# Patient Record
Sex: Female | Born: 1935 | Race: White | Hispanic: No | Smoking: Never smoker
Health system: Southern US, Community
[De-identification: ages and names within clinical notes are randomized; demographics above are authoritative.]

## PROBLEM LIST (undated history)

## (undated) DIAGNOSIS — R609 Edema, unspecified: Secondary | ICD-10-CM

## (undated) DIAGNOSIS — E079 Disorder of thyroid, unspecified: Secondary | ICD-10-CM

## (undated) DIAGNOSIS — F0390 Unspecified dementia without behavioral disturbance: Secondary | ICD-10-CM

## (undated) DIAGNOSIS — I10 Essential (primary) hypertension: Secondary | ICD-10-CM

---

## 2015-01-03 DIAGNOSIS — Z1272 Encounter for screening for malignant neoplasm of vagina: Secondary | ICD-10-CM | POA: Diagnosis not present

## 2015-01-03 DIAGNOSIS — N952 Postmenopausal atrophic vaginitis: Secondary | ICD-10-CM | POA: Diagnosis not present

## 2015-01-06 DIAGNOSIS — Z1272 Encounter for screening for malignant neoplasm of vagina: Secondary | ICD-10-CM | POA: Diagnosis not present

## 2015-01-06 DIAGNOSIS — N952 Postmenopausal atrophic vaginitis: Secondary | ICD-10-CM | POA: Diagnosis not present

## 2015-01-18 DIAGNOSIS — R509 Fever, unspecified: Secondary | ICD-10-CM | POA: Diagnosis not present

## 2015-01-18 DIAGNOSIS — J209 Acute bronchitis, unspecified: Secondary | ICD-10-CM | POA: Diagnosis not present

## 2015-01-21 DIAGNOSIS — R05 Cough: Secondary | ICD-10-CM | POA: Diagnosis not present

## 2015-01-21 DIAGNOSIS — J40 Bronchitis, not specified as acute or chronic: Secondary | ICD-10-CM | POA: Diagnosis not present

## 2015-01-21 DIAGNOSIS — Z9119 Patient's noncompliance with other medical treatment and regimen: Secondary | ICD-10-CM | POA: Diagnosis not present

## 2015-01-24 DIAGNOSIS — T50905A Adverse effect of unspecified drugs, medicaments and biological substances, initial encounter: Secondary | ICD-10-CM | POA: Diagnosis not present

## 2015-01-24 DIAGNOSIS — R531 Weakness: Secondary | ICD-10-CM | POA: Diagnosis not present

## 2015-01-24 DIAGNOSIS — R001 Bradycardia, unspecified: Secondary | ICD-10-CM | POA: Diagnosis not present

## 2015-01-24 DIAGNOSIS — B349 Viral infection, unspecified: Secondary | ICD-10-CM | POA: Diagnosis not present

## 2015-01-24 DIAGNOSIS — I447 Left bundle-branch block, unspecified: Secondary | ICD-10-CM | POA: Diagnosis not present

## 2015-01-24 DIAGNOSIS — R9431 Abnormal electrocardiogram [ECG] [EKG]: Secondary | ICD-10-CM | POA: Diagnosis not present

## 2015-01-24 DIAGNOSIS — Z79899 Other long term (current) drug therapy: Secondary | ICD-10-CM | POA: Diagnosis not present

## 2015-01-24 DIAGNOSIS — I10 Essential (primary) hypertension: Secondary | ICD-10-CM | POA: Diagnosis not present

## 2015-01-24 DIAGNOSIS — R05 Cough: Secondary | ICD-10-CM | POA: Diagnosis not present

## 2015-02-04 DIAGNOSIS — M545 Low back pain: Secondary | ICD-10-CM | POA: Diagnosis not present

## 2015-02-04 DIAGNOSIS — I10 Essential (primary) hypertension: Secondary | ICD-10-CM | POA: Diagnosis not present

## 2015-02-06 DIAGNOSIS — M5416 Radiculopathy, lumbar region: Secondary | ICD-10-CM | POA: Diagnosis not present

## 2015-02-07 DIAGNOSIS — M5416 Radiculopathy, lumbar region: Secondary | ICD-10-CM | POA: Diagnosis not present

## 2015-02-10 DIAGNOSIS — M5416 Radiculopathy, lumbar region: Secondary | ICD-10-CM | POA: Diagnosis not present

## 2015-02-12 DIAGNOSIS — M5416 Radiculopathy, lumbar region: Secondary | ICD-10-CM | POA: Diagnosis not present

## 2015-02-14 DIAGNOSIS — M5416 Radiculopathy, lumbar region: Secondary | ICD-10-CM | POA: Diagnosis not present

## 2015-02-17 DIAGNOSIS — M5416 Radiculopathy, lumbar region: Secondary | ICD-10-CM | POA: Diagnosis not present

## 2015-02-19 DIAGNOSIS — M5416 Radiculopathy, lumbar region: Secondary | ICD-10-CM | POA: Diagnosis not present

## 2015-02-21 DIAGNOSIS — M5416 Radiculopathy, lumbar region: Secondary | ICD-10-CM | POA: Diagnosis not present

## 2015-02-24 DIAGNOSIS — M5416 Radiculopathy, lumbar region: Secondary | ICD-10-CM | POA: Diagnosis not present

## 2015-02-26 DIAGNOSIS — M5416 Radiculopathy, lumbar region: Secondary | ICD-10-CM | POA: Diagnosis not present

## 2015-02-28 DIAGNOSIS — M5416 Radiculopathy, lumbar region: Secondary | ICD-10-CM | POA: Diagnosis not present

## 2015-03-03 DIAGNOSIS — F321 Major depressive disorder, single episode, moderate: Secondary | ICD-10-CM | POA: Diagnosis not present

## 2015-03-03 DIAGNOSIS — G3184 Mild cognitive impairment, so stated: Secondary | ICD-10-CM | POA: Diagnosis not present

## 2015-03-18 DIAGNOSIS — H40013 Open angle with borderline findings, low risk, bilateral: Secondary | ICD-10-CM | POA: Diagnosis not present

## 2015-03-18 DIAGNOSIS — H3531 Nonexudative age-related macular degeneration: Secondary | ICD-10-CM | POA: Diagnosis not present

## 2015-03-18 DIAGNOSIS — H2513 Age-related nuclear cataract, bilateral: Secondary | ICD-10-CM | POA: Diagnosis not present

## 2015-03-20 DIAGNOSIS — I10 Essential (primary) hypertension: Secondary | ICD-10-CM | POA: Diagnosis not present

## 2015-03-20 DIAGNOSIS — E782 Mixed hyperlipidemia: Secondary | ICD-10-CM | POA: Diagnosis not present

## 2015-03-20 DIAGNOSIS — F321 Major depressive disorder, single episode, moderate: Secondary | ICD-10-CM | POA: Diagnosis not present

## 2015-03-20 DIAGNOSIS — R413 Other amnesia: Secondary | ICD-10-CM | POA: Diagnosis not present

## 2015-03-20 DIAGNOSIS — Z8349 Family history of other endocrine, nutritional and metabolic diseases: Secondary | ICD-10-CM | POA: Diagnosis not present

## 2015-03-20 DIAGNOSIS — E039 Hypothyroidism, unspecified: Secondary | ICD-10-CM | POA: Diagnosis not present

## 2015-04-04 DIAGNOSIS — H25011 Cortical age-related cataract, right eye: Secondary | ICD-10-CM | POA: Diagnosis not present

## 2015-04-04 DIAGNOSIS — H2513 Age-related nuclear cataract, bilateral: Secondary | ICD-10-CM | POA: Diagnosis not present

## 2015-04-04 DIAGNOSIS — F322 Major depressive disorder, single episode, severe without psychotic features: Secondary | ICD-10-CM | POA: Diagnosis not present

## 2015-04-04 DIAGNOSIS — H40003 Preglaucoma, unspecified, bilateral: Secondary | ICD-10-CM | POA: Diagnosis not present

## 2015-04-04 DIAGNOSIS — H2511 Age-related nuclear cataract, right eye: Secondary | ICD-10-CM | POA: Diagnosis not present

## 2015-05-01 DIAGNOSIS — F332 Major depressive disorder, recurrent severe without psychotic features: Secondary | ICD-10-CM | POA: Diagnosis not present

## 2015-05-01 DIAGNOSIS — R413 Other amnesia: Secondary | ICD-10-CM | POA: Diagnosis not present

## 2015-05-13 DIAGNOSIS — R8290 Unspecified abnormal findings in urine: Secondary | ICD-10-CM | POA: Diagnosis not present

## 2015-05-13 DIAGNOSIS — R609 Edema, unspecified: Secondary | ICD-10-CM | POA: Diagnosis not present

## 2015-05-13 DIAGNOSIS — R413 Other amnesia: Secondary | ICD-10-CM | POA: Diagnosis not present

## 2015-05-13 DIAGNOSIS — F321 Major depressive disorder, single episode, moderate: Secondary | ICD-10-CM | POA: Diagnosis not present

## 2015-05-18 DIAGNOSIS — R112 Nausea with vomiting, unspecified: Secondary | ICD-10-CM | POA: Diagnosis not present

## 2015-05-18 DIAGNOSIS — K449 Diaphragmatic hernia without obstruction or gangrene: Secondary | ICD-10-CM | POA: Diagnosis not present

## 2015-05-18 DIAGNOSIS — I10 Essential (primary) hypertension: Secondary | ICD-10-CM | POA: Diagnosis not present

## 2015-05-18 DIAGNOSIS — E079 Disorder of thyroid, unspecified: Secondary | ICD-10-CM | POA: Diagnosis not present

## 2015-05-18 DIAGNOSIS — Z79899 Other long term (current) drug therapy: Secondary | ICD-10-CM | POA: Diagnosis not present

## 2015-05-18 DIAGNOSIS — K5900 Constipation, unspecified: Secondary | ICD-10-CM | POA: Diagnosis not present

## 2015-05-19 DIAGNOSIS — I10 Essential (primary) hypertension: Secondary | ICD-10-CM | POA: Diagnosis not present

## 2015-05-19 DIAGNOSIS — Z79899 Other long term (current) drug therapy: Secondary | ICD-10-CM | POA: Diagnosis not present

## 2015-05-19 DIAGNOSIS — R1110 Vomiting, unspecified: Secondary | ICD-10-CM | POA: Diagnosis not present

## 2015-05-21 DIAGNOSIS — G301 Alzheimer's disease with late onset: Secondary | ICD-10-CM | POA: Diagnosis not present

## 2015-05-21 DIAGNOSIS — Z9114 Patient's other noncompliance with medication regimen: Secondary | ICD-10-CM | POA: Diagnosis not present

## 2015-05-21 DIAGNOSIS — E039 Hypothyroidism, unspecified: Secondary | ICD-10-CM | POA: Diagnosis not present

## 2015-05-21 DIAGNOSIS — F0281 Dementia in other diseases classified elsewhere with behavioral disturbance: Secondary | ICD-10-CM | POA: Diagnosis not present

## 2015-05-21 DIAGNOSIS — I10 Essential (primary) hypertension: Secondary | ICD-10-CM | POA: Diagnosis not present

## 2015-05-23 DIAGNOSIS — G301 Alzheimer's disease with late onset: Secondary | ICD-10-CM | POA: Diagnosis not present

## 2015-05-23 DIAGNOSIS — I10 Essential (primary) hypertension: Secondary | ICD-10-CM | POA: Diagnosis not present

## 2015-05-27 DIAGNOSIS — I10 Essential (primary) hypertension: Secondary | ICD-10-CM | POA: Diagnosis not present

## 2015-05-27 DIAGNOSIS — G301 Alzheimer's disease with late onset: Secondary | ICD-10-CM | POA: Diagnosis not present

## 2015-05-30 DIAGNOSIS — G301 Alzheimer's disease with late onset: Secondary | ICD-10-CM | POA: Diagnosis not present

## 2015-05-30 DIAGNOSIS — I10 Essential (primary) hypertension: Secondary | ICD-10-CM | POA: Diagnosis not present

## 2015-06-06 DIAGNOSIS — I10 Essential (primary) hypertension: Secondary | ICD-10-CM | POA: Diagnosis not present

## 2015-06-06 DIAGNOSIS — G301 Alzheimer's disease with late onset: Secondary | ICD-10-CM | POA: Diagnosis not present

## 2015-06-10 DIAGNOSIS — Z111 Encounter for screening for respiratory tuberculosis: Secondary | ICD-10-CM | POA: Diagnosis not present

## 2015-06-10 DIAGNOSIS — I10 Essential (primary) hypertension: Secondary | ICD-10-CM | POA: Diagnosis not present

## 2015-06-10 DIAGNOSIS — G301 Alzheimer's disease with late onset: Secondary | ICD-10-CM | POA: Diagnosis not present

## 2015-06-13 DIAGNOSIS — I10 Essential (primary) hypertension: Secondary | ICD-10-CM | POA: Diagnosis not present

## 2015-06-13 DIAGNOSIS — G301 Alzheimer's disease with late onset: Secondary | ICD-10-CM | POA: Diagnosis not present

## 2015-06-24 DIAGNOSIS — F0281 Dementia in other diseases classified elsewhere with behavioral disturbance: Secondary | ICD-10-CM | POA: Diagnosis not present

## 2015-06-24 DIAGNOSIS — G301 Alzheimer's disease with late onset: Secondary | ICD-10-CM | POA: Diagnosis not present

## 2015-06-24 DIAGNOSIS — I10 Essential (primary) hypertension: Secondary | ICD-10-CM | POA: Diagnosis not present

## 2015-06-24 DIAGNOSIS — E039 Hypothyroidism, unspecified: Secondary | ICD-10-CM | POA: Diagnosis not present

## 2015-07-02 DIAGNOSIS — H2513 Age-related nuclear cataract, bilateral: Secondary | ICD-10-CM | POA: Diagnosis not present

## 2015-07-02 DIAGNOSIS — H2512 Age-related nuclear cataract, left eye: Secondary | ICD-10-CM | POA: Diagnosis not present

## 2015-07-02 DIAGNOSIS — H25011 Cortical age-related cataract, right eye: Secondary | ICD-10-CM | POA: Diagnosis not present

## 2015-07-02 DIAGNOSIS — H2511 Age-related nuclear cataract, right eye: Secondary | ICD-10-CM | POA: Diagnosis not present

## 2015-07-22 DIAGNOSIS — Z885 Allergy status to narcotic agent status: Secondary | ICD-10-CM | POA: Diagnosis not present

## 2015-07-22 DIAGNOSIS — E785 Hyperlipidemia, unspecified: Secondary | ICD-10-CM | POA: Diagnosis not present

## 2015-07-22 DIAGNOSIS — H25013 Cortical age-related cataract, bilateral: Secondary | ICD-10-CM | POA: Diagnosis not present

## 2015-07-22 DIAGNOSIS — Z791 Long term (current) use of non-steroidal anti-inflammatories (NSAID): Secondary | ICD-10-CM | POA: Diagnosis not present

## 2015-07-22 DIAGNOSIS — H40003 Preglaucoma, unspecified, bilateral: Secondary | ICD-10-CM | POA: Diagnosis not present

## 2015-07-22 DIAGNOSIS — H25011 Cortical age-related cataract, right eye: Secondary | ICD-10-CM | POA: Diagnosis not present

## 2015-07-22 DIAGNOSIS — Z888 Allergy status to other drugs, medicaments and biological substances status: Secondary | ICD-10-CM | POA: Diagnosis not present

## 2015-07-22 DIAGNOSIS — I10 Essential (primary) hypertension: Secondary | ICD-10-CM | POA: Diagnosis not present

## 2015-07-22 DIAGNOSIS — Z882 Allergy status to sulfonamides status: Secondary | ICD-10-CM | POA: Diagnosis not present

## 2015-07-22 DIAGNOSIS — H35363 Drusen (degenerative) of macula, bilateral: Secondary | ICD-10-CM | POA: Diagnosis not present

## 2015-07-22 DIAGNOSIS — H2513 Age-related nuclear cataract, bilateral: Secondary | ICD-10-CM | POA: Diagnosis not present

## 2015-07-22 DIAGNOSIS — Z884 Allergy status to anesthetic agent status: Secondary | ICD-10-CM | POA: Diagnosis not present

## 2015-07-22 DIAGNOSIS — E039 Hypothyroidism, unspecified: Secondary | ICD-10-CM | POA: Diagnosis not present

## 2015-07-22 DIAGNOSIS — Z881 Allergy status to other antibiotic agents status: Secondary | ICD-10-CM | POA: Diagnosis not present

## 2015-07-22 DIAGNOSIS — Z880 Allergy status to penicillin: Secondary | ICD-10-CM | POA: Diagnosis not present

## 2015-07-22 DIAGNOSIS — Z7982 Long term (current) use of aspirin: Secondary | ICD-10-CM | POA: Diagnosis not present

## 2015-07-22 DIAGNOSIS — H2511 Age-related nuclear cataract, right eye: Secondary | ICD-10-CM | POA: Diagnosis not present

## 2015-08-11 DIAGNOSIS — F0280 Dementia in other diseases classified elsewhere without behavioral disturbance: Secondary | ICD-10-CM | POA: Diagnosis not present

## 2015-08-11 DIAGNOSIS — G301 Alzheimer's disease with late onset: Secondary | ICD-10-CM | POA: Diagnosis not present

## 2015-09-03 DIAGNOSIS — H2512 Age-related nuclear cataract, left eye: Secondary | ICD-10-CM | POA: Diagnosis not present

## 2015-09-03 DIAGNOSIS — Z885 Allergy status to narcotic agent status: Secondary | ICD-10-CM | POA: Diagnosis not present

## 2015-09-03 DIAGNOSIS — Z79899 Other long term (current) drug therapy: Secondary | ICD-10-CM | POA: Diagnosis not present

## 2015-09-03 DIAGNOSIS — Z883 Allergy status to other anti-infective agents status: Secondary | ICD-10-CM | POA: Diagnosis not present

## 2015-09-03 DIAGNOSIS — I10 Essential (primary) hypertension: Secondary | ICD-10-CM | POA: Diagnosis not present

## 2015-09-03 DIAGNOSIS — H25042 Posterior subcapsular polar age-related cataract, left eye: Secondary | ICD-10-CM | POA: Diagnosis not present

## 2015-09-03 DIAGNOSIS — Z888 Allergy status to other drugs, medicaments and biological substances status: Secondary | ICD-10-CM | POA: Diagnosis not present

## 2015-09-03 DIAGNOSIS — E785 Hyperlipidemia, unspecified: Secondary | ICD-10-CM | POA: Diagnosis not present

## 2015-09-03 DIAGNOSIS — E039 Hypothyroidism, unspecified: Secondary | ICD-10-CM | POA: Diagnosis not present

## 2015-09-03 DIAGNOSIS — H25012 Cortical age-related cataract, left eye: Secondary | ICD-10-CM | POA: Diagnosis not present

## 2015-09-03 DIAGNOSIS — Z880 Allergy status to penicillin: Secondary | ICD-10-CM | POA: Diagnosis not present

## 2015-09-03 DIAGNOSIS — Z7982 Long term (current) use of aspirin: Secondary | ICD-10-CM | POA: Diagnosis not present

## 2015-10-02 DIAGNOSIS — Z1231 Encounter for screening mammogram for malignant neoplasm of breast: Secondary | ICD-10-CM | POA: Diagnosis not present

## 2015-10-30 DIAGNOSIS — R0789 Other chest pain: Secondary | ICD-10-CM | POA: Diagnosis not present

## 2015-10-30 DIAGNOSIS — G309 Alzheimer's disease, unspecified: Secondary | ICD-10-CM | POA: Diagnosis not present

## 2015-10-30 DIAGNOSIS — F0280 Dementia in other diseases classified elsewhere without behavioral disturbance: Secondary | ICD-10-CM | POA: Diagnosis not present

## 2015-10-30 DIAGNOSIS — R0602 Shortness of breath: Secondary | ICD-10-CM | POA: Diagnosis not present

## 2015-10-30 DIAGNOSIS — T781XXA Other adverse food reactions, not elsewhere classified, initial encounter: Secondary | ICD-10-CM | POA: Diagnosis not present

## 2015-10-30 DIAGNOSIS — Z79899 Other long term (current) drug therapy: Secondary | ICD-10-CM | POA: Diagnosis not present

## 2015-10-30 DIAGNOSIS — Z888 Allergy status to other drugs, medicaments and biological substances status: Secondary | ICD-10-CM | POA: Diagnosis not present

## 2015-10-30 DIAGNOSIS — T7840XA Allergy, unspecified, initial encounter: Secondary | ICD-10-CM | POA: Diagnosis not present

## 2015-10-30 DIAGNOSIS — R4182 Altered mental status, unspecified: Secondary | ICD-10-CM | POA: Diagnosis not present

## 2015-10-30 DIAGNOSIS — E785 Hyperlipidemia, unspecified: Secondary | ICD-10-CM | POA: Diagnosis not present

## 2015-10-30 DIAGNOSIS — Z881 Allergy status to other antibiotic agents status: Secondary | ICD-10-CM | POA: Diagnosis not present

## 2015-10-30 DIAGNOSIS — Z880 Allergy status to penicillin: Secondary | ICD-10-CM | POA: Diagnosis not present

## 2015-10-30 DIAGNOSIS — Z7982 Long term (current) use of aspirin: Secondary | ICD-10-CM | POA: Diagnosis not present

## 2015-10-30 DIAGNOSIS — H409 Unspecified glaucoma: Secondary | ICD-10-CM | POA: Diagnosis not present

## 2015-10-30 DIAGNOSIS — Z882 Allergy status to sulfonamides status: Secondary | ICD-10-CM | POA: Diagnosis not present

## 2015-10-30 DIAGNOSIS — I10 Essential (primary) hypertension: Secondary | ICD-10-CM | POA: Diagnosis not present

## 2015-10-30 DIAGNOSIS — R112 Nausea with vomiting, unspecified: Secondary | ICD-10-CM | POA: Diagnosis not present

## 2015-10-30 DIAGNOSIS — E039 Hypothyroidism, unspecified: Secondary | ICD-10-CM | POA: Diagnosis not present

## 2015-10-30 DIAGNOSIS — Z885 Allergy status to narcotic agent status: Secondary | ICD-10-CM | POA: Diagnosis not present

## 2016-01-03 DIAGNOSIS — N390 Urinary tract infection, site not specified: Secondary | ICD-10-CM | POA: Diagnosis not present

## 2016-01-03 DIAGNOSIS — R410 Disorientation, unspecified: Secondary | ICD-10-CM | POA: Diagnosis not present

## 2016-02-10 DIAGNOSIS — I509 Heart failure, unspecified: Secondary | ICD-10-CM | POA: Diagnosis not present

## 2016-02-10 DIAGNOSIS — R609 Edema, unspecified: Secondary | ICD-10-CM | POA: Diagnosis not present

## 2016-02-10 DIAGNOSIS — E039 Hypothyroidism, unspecified: Secondary | ICD-10-CM | POA: Diagnosis not present

## 2016-02-10 DIAGNOSIS — I10 Essential (primary) hypertension: Secondary | ICD-10-CM | POA: Diagnosis not present

## 2016-02-19 DIAGNOSIS — R9341 Abnormal radiologic findings on diagnostic imaging of renal pelvis, ureter, or bladder: Secondary | ICD-10-CM | POA: Diagnosis not present

## 2016-02-19 DIAGNOSIS — H3530 Unspecified macular degeneration: Secondary | ICD-10-CM | POA: Diagnosis not present

## 2016-02-19 DIAGNOSIS — Z884 Allergy status to anesthetic agent status: Secondary | ICD-10-CM | POA: Diagnosis not present

## 2016-02-19 DIAGNOSIS — I10 Essential (primary) hypertension: Secondary | ICD-10-CM | POA: Diagnosis not present

## 2016-02-19 DIAGNOSIS — Z880 Allergy status to penicillin: Secondary | ICD-10-CM | POA: Diagnosis not present

## 2016-02-19 DIAGNOSIS — R1084 Generalized abdominal pain: Secondary | ICD-10-CM | POA: Diagnosis not present

## 2016-02-19 DIAGNOSIS — Z882 Allergy status to sulfonamides status: Secondary | ICD-10-CM | POA: Diagnosis not present

## 2016-02-19 DIAGNOSIS — Z7982 Long term (current) use of aspirin: Secondary | ICD-10-CM | POA: Diagnosis not present

## 2016-02-19 DIAGNOSIS — N390 Urinary tract infection, site not specified: Secondary | ICD-10-CM | POA: Diagnosis not present

## 2016-02-19 DIAGNOSIS — F0390 Unspecified dementia without behavioral disturbance: Secondary | ICD-10-CM | POA: Diagnosis not present

## 2016-02-19 DIAGNOSIS — R319 Hematuria, unspecified: Secondary | ICD-10-CM | POA: Diagnosis not present

## 2016-02-19 DIAGNOSIS — E039 Hypothyroidism, unspecified: Secondary | ICD-10-CM | POA: Diagnosis not present

## 2016-02-19 DIAGNOSIS — R079 Chest pain, unspecified: Secondary | ICD-10-CM | POA: Diagnosis not present

## 2016-02-19 DIAGNOSIS — K449 Diaphragmatic hernia without obstruction or gangrene: Secondary | ICD-10-CM | POA: Diagnosis not present

## 2016-02-19 DIAGNOSIS — J329 Chronic sinusitis, unspecified: Secondary | ICD-10-CM | POA: Diagnosis not present

## 2016-02-19 DIAGNOSIS — R197 Diarrhea, unspecified: Secondary | ICD-10-CM | POA: Diagnosis not present

## 2016-02-19 DIAGNOSIS — Z881 Allergy status to other antibiotic agents status: Secondary | ICD-10-CM | POA: Diagnosis not present

## 2016-02-19 DIAGNOSIS — Z886 Allergy status to analgesic agent status: Secondary | ICD-10-CM | POA: Diagnosis not present

## 2016-02-19 DIAGNOSIS — E785 Hyperlipidemia, unspecified: Secondary | ICD-10-CM | POA: Diagnosis not present

## 2016-02-19 DIAGNOSIS — R112 Nausea with vomiting, unspecified: Secondary | ICD-10-CM | POA: Diagnosis not present

## 2016-02-19 DIAGNOSIS — Z888 Allergy status to other drugs, medicaments and biological substances status: Secondary | ICD-10-CM | POA: Diagnosis not present

## 2016-02-19 DIAGNOSIS — Z79899 Other long term (current) drug therapy: Secondary | ICD-10-CM | POA: Diagnosis not present

## 2016-02-19 DIAGNOSIS — R4182 Altered mental status, unspecified: Secondary | ICD-10-CM | POA: Diagnosis not present

## 2016-04-01 DIAGNOSIS — N3000 Acute cystitis without hematuria: Secondary | ICD-10-CM | POA: Diagnosis not present

## 2016-04-01 DIAGNOSIS — E039 Hypothyroidism, unspecified: Secondary | ICD-10-CM | POA: Diagnosis not present

## 2016-04-01 DIAGNOSIS — G301 Alzheimer's disease with late onset: Secondary | ICD-10-CM | POA: Diagnosis not present

## 2016-04-01 DIAGNOSIS — E7800 Pure hypercholesterolemia, unspecified: Secondary | ICD-10-CM | POA: Diagnosis not present

## 2016-04-01 DIAGNOSIS — I10 Essential (primary) hypertension: Secondary | ICD-10-CM | POA: Diagnosis not present

## 2016-04-01 DIAGNOSIS — F0280 Dementia in other diseases classified elsewhere without behavioral disturbance: Secondary | ICD-10-CM | POA: Diagnosis not present

## 2016-04-01 DIAGNOSIS — Z79899 Other long term (current) drug therapy: Secondary | ICD-10-CM | POA: Diagnosis not present

## 2016-04-22 DIAGNOSIS — N390 Urinary tract infection, site not specified: Secondary | ICD-10-CM | POA: Diagnosis not present

## 2016-04-22 DIAGNOSIS — I10 Essential (primary) hypertension: Secondary | ICD-10-CM | POA: Diagnosis not present

## 2016-04-22 DIAGNOSIS — E039 Hypothyroidism, unspecified: Secondary | ICD-10-CM | POA: Diagnosis not present

## 2016-04-22 DIAGNOSIS — F0390 Unspecified dementia without behavioral disturbance: Secondary | ICD-10-CM | POA: Diagnosis not present

## 2016-04-28 DIAGNOSIS — N390 Urinary tract infection, site not specified: Secondary | ICD-10-CM | POA: Diagnosis not present

## 2016-04-28 DIAGNOSIS — I10 Essential (primary) hypertension: Secondary | ICD-10-CM | POA: Diagnosis not present

## 2016-04-28 DIAGNOSIS — E039 Hypothyroidism, unspecified: Secondary | ICD-10-CM | POA: Diagnosis not present

## 2016-04-28 DIAGNOSIS — F0390 Unspecified dementia without behavioral disturbance: Secondary | ICD-10-CM | POA: Diagnosis not present

## 2016-05-04 DIAGNOSIS — I10 Essential (primary) hypertension: Secondary | ICD-10-CM | POA: Diagnosis not present

## 2016-05-04 DIAGNOSIS — R319 Hematuria, unspecified: Secondary | ICD-10-CM | POA: Diagnosis not present

## 2016-05-04 DIAGNOSIS — F0390 Unspecified dementia without behavioral disturbance: Secondary | ICD-10-CM | POA: Diagnosis not present

## 2016-05-04 DIAGNOSIS — N390 Urinary tract infection, site not specified: Secondary | ICD-10-CM | POA: Diagnosis not present

## 2016-05-20 DIAGNOSIS — G301 Alzheimer's disease with late onset: Secondary | ICD-10-CM | POA: Diagnosis not present

## 2016-05-20 DIAGNOSIS — F0280 Dementia in other diseases classified elsewhere without behavioral disturbance: Secondary | ICD-10-CM | POA: Diagnosis not present

## 2016-05-20 DIAGNOSIS — N183 Chronic kidney disease, stage 3 (moderate): Secondary | ICD-10-CM | POA: Diagnosis not present

## 2016-05-20 DIAGNOSIS — I129 Hypertensive chronic kidney disease with stage 1 through stage 4 chronic kidney disease, or unspecified chronic kidney disease: Secondary | ICD-10-CM | POA: Diagnosis not present

## 2016-07-15 DIAGNOSIS — Z79899 Other long term (current) drug therapy: Secondary | ICD-10-CM | POA: Diagnosis not present

## 2016-07-15 DIAGNOSIS — I10 Essential (primary) hypertension: Secondary | ICD-10-CM | POA: Diagnosis not present

## 2016-07-15 DIAGNOSIS — Z23 Encounter for immunization: Secondary | ICD-10-CM | POA: Diagnosis not present

## 2016-07-15 DIAGNOSIS — N183 Chronic kidney disease, stage 3 (moderate): Secondary | ICD-10-CM | POA: Diagnosis not present

## 2016-07-15 DIAGNOSIS — F0280 Dementia in other diseases classified elsewhere without behavioral disturbance: Secondary | ICD-10-CM | POA: Diagnosis not present

## 2016-07-15 DIAGNOSIS — E7800 Pure hypercholesterolemia, unspecified: Secondary | ICD-10-CM | POA: Diagnosis not present

## 2016-07-15 DIAGNOSIS — I129 Hypertensive chronic kidney disease with stage 1 through stage 4 chronic kidney disease, or unspecified chronic kidney disease: Secondary | ICD-10-CM | POA: Diagnosis not present

## 2016-07-15 DIAGNOSIS — G301 Alzheimer's disease with late onset: Secondary | ICD-10-CM | POA: Diagnosis not present

## 2016-09-02 DIAGNOSIS — Z23 Encounter for immunization: Secondary | ICD-10-CM | POA: Diagnosis not present

## 2016-09-02 DIAGNOSIS — I129 Hypertensive chronic kidney disease with stage 1 through stage 4 chronic kidney disease, or unspecified chronic kidney disease: Secondary | ICD-10-CM | POA: Diagnosis not present

## 2016-09-02 DIAGNOSIS — N183 Chronic kidney disease, stage 3 (moderate): Secondary | ICD-10-CM | POA: Diagnosis not present

## 2016-09-22 DIAGNOSIS — N390 Urinary tract infection, site not specified: Secondary | ICD-10-CM | POA: Diagnosis not present

## 2016-09-22 DIAGNOSIS — R319 Hematuria, unspecified: Secondary | ICD-10-CM | POA: Diagnosis not present

## 2016-09-22 DIAGNOSIS — I10 Essential (primary) hypertension: Secondary | ICD-10-CM | POA: Diagnosis not present

## 2016-09-22 DIAGNOSIS — F0390 Unspecified dementia without behavioral disturbance: Secondary | ICD-10-CM | POA: Diagnosis not present

## 2016-09-22 DIAGNOSIS — E039 Hypothyroidism, unspecified: Secondary | ICD-10-CM | POA: Diagnosis not present

## 2016-09-22 DIAGNOSIS — Z79899 Other long term (current) drug therapy: Secondary | ICD-10-CM | POA: Diagnosis not present

## 2016-10-13 DIAGNOSIS — H353132 Nonexudative age-related macular degeneration, bilateral, intermediate dry stage: Secondary | ICD-10-CM | POA: Diagnosis not present

## 2016-10-13 DIAGNOSIS — Z961 Presence of intraocular lens: Secondary | ICD-10-CM | POA: Diagnosis not present

## 2016-10-20 DIAGNOSIS — N390 Urinary tract infection, site not specified: Secondary | ICD-10-CM | POA: Diagnosis not present

## 2016-10-20 DIAGNOSIS — R350 Frequency of micturition: Secondary | ICD-10-CM | POA: Diagnosis not present

## 2016-10-20 DIAGNOSIS — R351 Nocturia: Secondary | ICD-10-CM | POA: Diagnosis not present

## 2016-11-12 ENCOUNTER — Emergency Department (HOSPITAL_COMMUNITY)
Admission: EM | Admit: 2016-11-12 | Discharge: 2016-11-12 | Disposition: A | Discharge status: Home / Self Care | Payer: Medicare Other | Attending: Emergency Medicine | Admitting: Emergency Medicine

## 2016-11-12 ENCOUNTER — Emergency Department (HOSPITAL_COMMUNITY): Payer: Medicare Other

## 2016-11-12 ENCOUNTER — Encounter (HOSPITAL_COMMUNITY): Payer: Self-pay | Admitting: Emergency Medicine

## 2016-11-12 DIAGNOSIS — Z7982 Long term (current) use of aspirin: Secondary | ICD-10-CM | POA: Insufficient documentation

## 2016-11-12 DIAGNOSIS — R0602 Shortness of breath: Secondary | ICD-10-CM | POA: Diagnosis not present

## 2016-11-12 DIAGNOSIS — E860 Dehydration: Secondary | ICD-10-CM

## 2016-11-12 DIAGNOSIS — R531 Weakness: Secondary | ICD-10-CM

## 2016-11-12 DIAGNOSIS — I10 Essential (primary) hypertension: Secondary | ICD-10-CM | POA: Diagnosis not present

## 2016-11-12 DIAGNOSIS — Z79899 Other long term (current) drug therapy: Secondary | ICD-10-CM | POA: Diagnosis not present

## 2016-11-12 DIAGNOSIS — R55 Syncope and collapse: Secondary | ICD-10-CM | POA: Diagnosis not present

## 2016-11-12 DIAGNOSIS — N390 Urinary tract infection, site not specified: Secondary | ICD-10-CM | POA: Diagnosis not present

## 2016-11-12 HISTORY — DX: Unspecified dementia, unspecified severity, without behavioral disturbance, psychotic disturbance, mood disturbance, and anxiety: F03.90

## 2016-11-12 HISTORY — DX: Disorder of thyroid, unspecified: E07.9

## 2016-11-12 HISTORY — DX: Edema, unspecified: R60.9

## 2016-11-12 HISTORY — DX: Essential (primary) hypertension: I10

## 2016-11-12 LAB — CBC
HEMATOCRIT: 38.9 % (ref 36.0–46.0)
Hemoglobin: 13.1 g/dL (ref 12.0–15.0)
MCH: 28.2 pg (ref 26.0–34.0)
MCHC: 33.7 g/dL (ref 30.0–36.0)
MCV: 83.8 fL (ref 78.0–100.0)
Platelets: 310 10*3/uL (ref 150–400)
RBC: 4.64 MIL/uL (ref 3.87–5.11)
RDW: 15.1 % (ref 11.5–15.5)
WBC: 10.5 10*3/uL (ref 4.0–10.5)

## 2016-11-12 LAB — URINALYSIS, ROUTINE W REFLEX MICROSCOPIC
BILIRUBIN URINE: NEGATIVE
GLUCOSE, UA: NEGATIVE mg/dL
HGB URINE DIPSTICK: NEGATIVE
KETONES UR: NEGATIVE mg/dL
Leukocytes, UA: NEGATIVE
NITRITE: NEGATIVE
PH: 8 (ref 5.0–8.0)
Protein, ur: NEGATIVE mg/dL
Specific Gravity, Urine: 1.005 (ref 1.005–1.030)

## 2016-11-12 LAB — BASIC METABOLIC PANEL
Anion gap: 9 (ref 5–15)
BUN: 21 mg/dL — AB (ref 6–20)
CO2: 29 mmol/L (ref 22–32)
Calcium: 9.8 mg/dL (ref 8.9–10.3)
Chloride: 102 mmol/L (ref 101–111)
Creatinine, Ser: 1.57 mg/dL — ABNORMAL HIGH (ref 0.44–1.00)
GFR calc Af Amer: 34 mL/min — ABNORMAL LOW (ref >60)
GFR, EST NON AFRICAN AMERICAN: 29 mL/min — AB (ref >60)
GLUCOSE: 118 mg/dL — AB (ref 65–99)
POTASSIUM: 3.2 mmol/L — AB (ref 3.5–5.1)
Sodium: 140 mmol/L (ref 135–145)

## 2016-11-12 LAB — CBG MONITORING, ED: Glucose-Capillary: 141 mg/dL — ABNORMAL HIGH (ref 65–99)

## 2016-11-12 MED ORDER — SODIUM CHLORIDE 0.9 % IV BOLUS (SEPSIS)
1000.0000 mL | Freq: Once | INTRAVENOUS | Status: AC
  Administered 2016-11-12: 1000 mL via INTRAVENOUS

## 2016-11-12 NOTE — ED Notes (Signed)
Pt is escorted with her son who states that patient passed out for 30-45 seconds. Pt son states that she became weak and noticed her breathing pattern did change, reports that she was not injured or had a fall, that she was caught and placed in a wheelchair. Pt has dementia and is unable to answer questions. Pt son does report that patient had decreased urine output. Pt O2 sat id [id number] on room air. Placed pt on 2 lpm  of oxygen.

## 2016-11-12 NOTE — Progress Notes (Signed)
Pt and female visitor confirms pcp as hal stoneking  EPIC updated

## 2016-11-12 NOTE — ED Triage Notes (Signed)
Per son as pt has dementia. Pt was walking after just finishing a renal US at [facility] when she had a syncopal episode. Pt was set in chair prior to syncopal episode. Pt has complained of feeling weak today. Pt disoriented to place and situation.  Pt lives [facility]. Has allergies to multiple things.

## 2016-11-12 NOTE — ED Provider Notes (Signed)
[facility]-EMERGENCY DEPT Provider Note   CSN: [id number] Arrival date & time: 11/12/16  1120     History   Chief Complaint Chief Complaint  Patient presents with  . Loss of Consciousness    HPI [place] Halseth is a 82 y.o. female.  The history is provided by the patient and a relative.   Patient sent to the emergency department from [facility] after a reported syncopal episode.  She is seen by urology for recurrent urinary tract infections and today was receiving an ultrasound of her kidneys.  Patient reports that when she was walking back she became lightheaded and the family member reports that she had no episode of syncope.  No preceding chest pain or palpitations.  Patient without complaints at this time.  Patient does have mild dementia and therefore does not remember details of the event.  The majority history is obtained from the patient's son given her history of dementia   Past Medical History:  Diagnosis Date  . Dementia   . Edema   . Hypertension   . Thyroid disease     There are no active problems to display for this patient.   History reviewed. No pertinent surgical history.  OB History    No data available       Home Medications    Prior to Admission medications   Medication Sig Start Date End Date Taking? Authorizing Provider  acetaminophen (TYLENOL) 500 MG tablet Take 500 mg by mouth 3 (three) times daily as needed for moderate pain, fever or headache (max 3000mg  in 24 hours).   Yes Historical Provider, MD  amLODipine (NORVASC) 5 MG tablet Take 5 mg by mouth daily.   Yes Historical Provider, MD  aspirin 81 MG chewable tablet Chew 81 mg by mouth daily.   Yes Historical Provider, MD  calcium carbonate (TUMS - DOSED IN MG ELEMENTAL CALCIUM) 500 MG chewable tablet Chew 1,000 mg by mouth 2 (two) times daily.   Yes Historical Provider, MD  Cholecalciferol (VITAMIN D3) 1000 units CAPS Take 1,000 Units by mouth daily.   Yes Historical Provider, MD    clindamycin (CLEOCIN) 150 MG capsule Take 500 mg by mouth daily as needed (to take 1 hour before procedure).   Yes Historical Provider, MD  cloNIDine (CATAPRES) 0.1 MG tablet Take 0.1 mg by mouth every evening.   Yes Historical Provider, MD  docusate sodium (COLACE) 100 MG capsule Take 100 mg by mouth at bedtime.   Yes Historical Provider, MD  ezetimibe-simvastatin (VYTORIN) 10-20 MG tablet Take 1 tablet by mouth daily.   Yes Historical Provider, MD  fenofibrate micronized (LOFIBRA) 67 MG capsule Take 67 mg by mouth daily before breakfast.   Yes Historical Provider, MD  furosemide (LASIX) 20 MG tablet Take 20 mg by mouth daily.   Yes Historical Provider, MD  l-methylfolate-B6-B12 (METANX) 3-35-2 MG TABS tablet Take 1 tablet by mouth daily.   Yes Historical Provider, MD  levothyroxine (SYNTHROID, LEVOTHROID) 25 MCG tablet Take 25 mcg by mouth daily before breakfast.   Yes Historical Provider, MD  memantine (NAMENDA XR) 28 MG CP24 24 hr capsule Take 28 mg by mouth daily.   Yes Historical Provider, MD  Multiple Vitamins-Minerals (PRESERVISION AREDS 2 PO) Take 1 capsule by mouth 2 (two) times daily.   Yes Historical Provider, MD  Omega-3 Fatty Acids (FISH OIL) 1000 MG CAPS Take 1,000 mg by mouth 2 (two) times daily.   Yes Historical Provider, MD  ondansetron (ZOFRAN) 8 MG tablet Take 8  mg by mouth every 8 (eight) hours as needed for nausea or vomiting.   Yes Historical Provider, MD  lisinopril (PRINIVIL,ZESTRIL) 10 MG tablet Take 10 mg by mouth daily.    Historical Provider, MD  oseltamivir (TAMIFLU) 75 MG capsule Take 75 mg by mouth 2 (two) times daily.    Historical Provider, MD    Family History History reviewed. No pertinent family history.  Social History Social History  Substance Use Topics  . Smoking status: Never Smoker  . Smokeless tobacco: Never Used  . Alcohol use No     Allergies   Bee venom; Aricept [donepezil hcl]; Codeine; Lidocaine; Orphenadrine; Other; and Sulfa  antibiotics   Review of Systems Review of Systems  Unable to perform ROS: Dementia     Physical Exam Updated Vital Signs BP 177/78   Pulse 73   Temp 97.6 F (36.4 C) (Oral)   Resp 24   SpO2 95%   Physical Exam  Constitutional: She is oriented to person, place, and time. She appears well-developed and well-nourished. No distress.  HENT:  Head: Normocephalic and atraumatic.  Eyes: EOM are normal.  Neck: Normal range of motion.  Cardiovascular: Normal rate, regular rhythm and normal heart sounds.   Pulmonary/Chest: Effort normal and breath sounds normal.  Abdominal: Soft. She exhibits no distension. There is no tenderness.  Musculoskeletal: Normal range of motion.  Neurological: She is alert and oriented to person, place, and time.  Skin: Skin is warm and dry.  Psychiatric: She has a normal mood and affect. Judgment normal.  Nursing note and vitals reviewed.    ED Treatments / Results  Labs (all labs ordered are listed, but only abnormal results are displayed) Labs Reviewed  BASIC METABOLIC PANEL - Abnormal; Notable for the following:       Result Value   Potassium 3.2 (*)    Glucose, Bld 118 (*)    BUN 21 (*)    Creatinine, Ser 1.57 (*)    GFR calc non Af Amer 29 (*)    GFR calc Af Amer 34 (*)    All other components within normal limits  URINALYSIS, ROUTINE W REFLEX MICROSCOPIC - Abnormal; Notable for the following:    Color, Urine STRAW (*)    All other components within normal limits  CBG MONITORING, ED - Abnormal; Notable for the following:    Glucose-Capillary 141 (*)    All other components within normal limits  CBC    EKG  EKG Interpretation  Date/Time:  Friday November 12 2016 11:41:11 EST Ventricular Rate:  72 PR Interval:    QRS Duration: 97 QT Interval:  426 QTC Calculation: 467 R Axis:   24 Text Interpretation:  Sinus rhythm Anterior infarct, old Borderline repolarization abnormality No old tracing to compare Artifact Confirmed by Bryssa Tones   MD, Caryn Bee ([id number]) on 11/12/2016 12:33:02 PM       Radiology Dg Chest 2 View  Result Date: 11/12/2016 CLINICAL DATA:  Initial evaluation for acute shortness of breath. History of hypertension. EXAM: CHEST  2 VIEW COMPARISON:  None available. FINDINGS: Mild accentuation of the cardiac silhouette related AP technique, felt to be within normal limits. Mediastinal silhouette is normal. Lungs mildly hypoinflated with elevation of the right hemidiaphragm. Lungs are clear without focal infiltrate, pulmonary edema, or pleural effusion. Minimal linear opacity within the left lower lobe compatible with atelectasis and/ or scar. No definite pneumothorax, although evaluation limited by technique. No acute osseous abnormality. Degenerative changes noted about the shoulders. IMPRESSION: 1. No  active cardiopulmonary disease. 2. Mild atelectasis and/or scarring within the left lower lobe. Electronically Signed   By: Rise MuBenjamin  McClintock M.D.   On: 11/12/2016 14:00    Procedures Procedures (including critical care time)  Medications Ordered in ED Medications  sodium chloride 0.9 % bolus 1,000 mL (0 mLs Intravenous Stopped 11/12/16 1509)     Initial Impression / Assessment and Plan / ED Course  I have reviewed the triage vital signs and the nursing notes.  Pertinent labs & imaging results that were available during my care of the patient were reviewed by me and considered in my medical decision making (see chart for details).  Clinical Course     Patient feels much better after IV fluids.  She was able to ambulate in the hall without difficulty.  She states she feels better.  No clear etiology of her syncopal episode or generalized weakness.  Labs, urine, EKG without abnormalities.  After walking back from the bathroom her O2 sats were 91% and her heart rate was 95%.  Close primary care follow-up.  She and her son understand to return to the ER for new or worsening symptoms  Final Clinical Impressions(s)  / ED Diagnoses   Final diagnoses:  Syncope, unspecified syncope type  Weakness  Dehydration    New Prescriptions New Prescriptions   No medications on file     Azalia BilisKevin Verleen Stuckey, MD 11/12/16 1539

## 2016-11-25 ENCOUNTER — Other Ambulatory Visit: Payer: Self-pay | Admitting: Geriatric Medicine

## 2016-11-25 DIAGNOSIS — R410 Disorientation, unspecified: Secondary | ICD-10-CM | POA: Diagnosis not present

## 2016-11-25 DIAGNOSIS — F0280 Dementia in other diseases classified elsewhere without behavioral disturbance: Secondary | ICD-10-CM | POA: Diagnosis not present

## 2016-11-25 DIAGNOSIS — N183 Chronic kidney disease, stage 3 (moderate): Secondary | ICD-10-CM | POA: Diagnosis not present

## 2016-11-25 DIAGNOSIS — I129 Hypertensive chronic kidney disease with stage 1 through stage 4 chronic kidney disease, or unspecified chronic kidney disease: Secondary | ICD-10-CM | POA: Diagnosis not present

## 2016-11-25 DIAGNOSIS — G301 Alzheimer's disease with late onset: Secondary | ICD-10-CM | POA: Diagnosis not present

## 2016-12-03 ENCOUNTER — Ambulatory Visit
Admission: RE | Admit: 2016-12-03 | Discharge: 2016-12-03 | Disposition: A | Discharge status: Home / Self Care | Payer: Medicare Other | Source: Ambulatory Visit | Attending: Geriatric Medicine | Admitting: Geriatric Medicine

## 2016-12-03 DIAGNOSIS — R410 Disorientation, unspecified: Secondary | ICD-10-CM

## 2016-12-03 DIAGNOSIS — R55 Syncope and collapse: Secondary | ICD-10-CM | POA: Diagnosis not present

## 2017-01-04 DIAGNOSIS — I129 Hypertensive chronic kidney disease with stage 1 through stage 4 chronic kidney disease, or unspecified chronic kidney disease: Secondary | ICD-10-CM | POA: Diagnosis not present

## 2017-01-04 DIAGNOSIS — N183 Chronic kidney disease, stage 3 (moderate): Secondary | ICD-10-CM | POA: Diagnosis not present

## 2017-01-04 DIAGNOSIS — G301 Alzheimer's disease with late onset: Secondary | ICD-10-CM | POA: Diagnosis not present

## 2017-02-01 DIAGNOSIS — I129 Hypertensive chronic kidney disease with stage 1 through stage 4 chronic kidney disease, or unspecified chronic kidney disease: Secondary | ICD-10-CM | POA: Diagnosis not present

## 2017-02-01 DIAGNOSIS — F0280 Dementia in other diseases classified elsewhere without behavioral disturbance: Secondary | ICD-10-CM | POA: Diagnosis not present

## 2017-02-01 DIAGNOSIS — N183 Chronic kidney disease, stage 3 (moderate): Secondary | ICD-10-CM | POA: Diagnosis not present

## 2017-02-01 DIAGNOSIS — G301 Alzheimer's disease with late onset: Secondary | ICD-10-CM | POA: Diagnosis not present

## 2017-04-01 DIAGNOSIS — H353133 Nonexudative age-related macular degeneration, bilateral, advanced atrophic without subfoveal involvement: Secondary | ICD-10-CM | POA: Diagnosis not present

## 2017-04-11 DIAGNOSIS — Z111 Encounter for screening for respiratory tuberculosis: Secondary | ICD-10-CM | POA: Diagnosis not present

## 2017-04-22 ENCOUNTER — Encounter (HOSPITAL_COMMUNITY): Payer: Self-pay | Admitting: Emergency Medicine

## 2017-04-22 ENCOUNTER — Emergency Department (HOSPITAL_COMMUNITY)
Admission: EM | Admit: 2017-04-22 | Discharge: 2017-04-22 | Disposition: A | Discharge status: Home / Self Care | Payer: Medicare Other | Attending: Emergency Medicine | Admitting: Emergency Medicine

## 2017-04-22 ENCOUNTER — Emergency Department (HOSPITAL_COMMUNITY): Payer: Medicare Other

## 2017-04-22 DIAGNOSIS — Z79899 Other long term (current) drug therapy: Secondary | ICD-10-CM | POA: Insufficient documentation

## 2017-04-22 DIAGNOSIS — Z7982 Long term (current) use of aspirin: Secondary | ICD-10-CM | POA: Diagnosis not present

## 2017-04-22 DIAGNOSIS — M47899 Other spondylosis, site unspecified: Secondary | ICD-10-CM | POA: Diagnosis not present

## 2017-04-22 DIAGNOSIS — R52 Pain, unspecified: Secondary | ICD-10-CM | POA: Diagnosis not present

## 2017-04-22 DIAGNOSIS — M549 Dorsalgia, unspecified: Secondary | ICD-10-CM | POA: Diagnosis not present

## 2017-04-22 DIAGNOSIS — R4182 Altered mental status, unspecified: Secondary | ICD-10-CM | POA: Diagnosis not present

## 2017-04-22 DIAGNOSIS — I10 Essential (primary) hypertension: Secondary | ICD-10-CM | POA: Insufficient documentation

## 2017-04-22 DIAGNOSIS — M47816 Spondylosis without myelopathy or radiculopathy, lumbar region: Secondary | ICD-10-CM

## 2017-04-22 DIAGNOSIS — M545 Low back pain: Secondary | ICD-10-CM

## 2017-04-22 DIAGNOSIS — M5489 Other dorsalgia: Secondary | ICD-10-CM | POA: Diagnosis not present

## 2017-04-22 MED ORDER — ACETAMINOPHEN 325 MG PO TABS
650.0000 mg | ORAL_TABLET | Freq: Once | ORAL | Status: AC
  Administered 2017-04-22: 650 mg via ORAL
  Filled 2017-04-22: qty 2

## 2017-04-22 NOTE — ED Notes (Signed)
Patient denies any symptoms at this time

## 2017-04-22 NOTE — Discharge Instructions (Signed)
The testing today was consistent with arthritis causing the low back pain.  Treatments that can help include heat applications on the sore areas and take Tylenol every 4 hours for pain.  Follow-up with your primary care doctor for further evaluation and treatment, as needed.

## 2017-04-22 NOTE — ED Notes (Signed)
Patient is alert and oriented to baseline.  PTAR was given DC instructions and follow up visit instructions.  Patient unable to give  verbal understanding. She was DC via stretcher to nursing home.  V/S stable.  sHe was not showing any signs of distress on DC

## 2017-04-22 NOTE — ED Triage Notes (Signed)
Per EMS. Pt from [facility]. Hx of dementia, oriented per norm, confused to date.  Pt reports R lower back pain since last night. No known injury. No injury or bruising noted. Pain does not change with palpation or movement. Denies urinary symptoms.

## 2017-04-22 NOTE — ED Provider Notes (Signed)
[facility]-EMERGENCY DEPT Provider Note   CSN: [id number] Arrival date & time: 04/22/17  0944     History   Chief Complaint Chief Complaint  Patient presents with  . Back Pain    HPI Kathryn Simmons is a 83 y.o. female.  Patient sent here for evaluation of low back pain, by report of her nursing care facility.  She is unable to give history.  Level 5 caveat-altered mental status  HPI  Past Medical History:  Diagnosis Date  . Dementia   . Edema   . Hypertension   . Thyroid disease     There are no active problems to display for this patient.   History reviewed. No pertinent surgical history.  OB History    No data available       Home Medications    Prior to Admission medications   Medication Sig Start Date End Date Taking? Authorizing Provider  acetaminophen (TYLENOL) 500 MG tablet Take 500 mg by mouth 3 (three) times daily as needed for moderate pain, fever or headache (max 3000mg  in 24 hours).    [provider]  amLODipine (NORVASC) 5 MG tablet Take 5 mg by mouth daily.    [provider]  aspirin 81 MG chewable tablet Chew 81 mg by mouth daily.    [provider]  calcium carbonate (TUMS - DOSED IN MG ELEMENTAL CALCIUM) 500 MG chewable tablet Chew 1,000 mg by mouth 2 (two) times daily.    [provider]  Cholecalciferol (VITAMIN D3) 1000 units CAPS Take 1,000 Units by mouth daily.    [provider]  clindamycin (CLEOCIN) 150 MG capsule Take 500 mg by mouth daily as needed (to take 1 hour before procedure).    [provider]  cloNIDine (CATAPRES) 0.1 MG tablet Take 0.1 mg by mouth every evening.    [provider]  docusate sodium (COLACE) 100 MG capsule Take 100 mg by mouth at bedtime.    [provider]  ezetimibe-simvastatin (VYTORIN) 10-20 MG tablet Take 1 tablet by mouth daily.    [provider]  fenofibrate micronized (LOFIBRA) 67 MG capsule Take 67 mg by mouth daily before  breakfast.    [provider]  furosemide (LASIX) 20 MG tablet Take 20 mg by mouth daily.    [provider]  l-methylfolate-B6-B12 (METANX) 3-35-2 MG TABS tablet Take 1 tablet by mouth daily.    [provider]  levothyroxine (SYNTHROID, LEVOTHROID) 25 MCG tablet Take 25 mcg by mouth daily before breakfast.    [provider]  lisinopril (PRINIVIL,ZESTRIL) 10 MG tablet Take 10 mg by mouth daily.    [provider]  memantine (NAMENDA XR) 28 MG CP24 24 hr capsule Take 28 mg by mouth daily.    [provider]  Multiple Vitamins-Minerals (PRESERVISION AREDS 2 PO) Take 1 capsule by mouth 2 (two) times daily.    [provider]  Omega-3 Fatty Acids (FISH OIL) 1000 MG CAPS Take 1,000 mg by mouth 2 (two) times daily.    [provider]  ondansetron (ZOFRAN) 8 MG tablet Take 8 mg by mouth every 8 (eight) hours as needed for nausea or vomiting.    [provider]  oseltamivir (TAMIFLU) 75 MG capsule Take 75 mg by mouth 2 (two) times daily.    [provider]    Family History History reviewed. No pertinent family history.  Social History Social History  Substance Use Topics  . Smoking status: Never Smoker  .  Smokeless tobacco: Never Used  . Alcohol use No     Allergies   Bee venom; Amoxicillin; Anaprox [naproxen]; Aricept [donepezil hcl]; Biaxin [clarithromycin]; Ciprofloxacin; Codeine; Keflex [cephalexin]; Levaquin [levofloxacin]; Lidocaine; Macrobid [nitrofurantoin macrocrystal]; Norflex [orphenadrine citrate]; Orphenadrine; Other; Septra [sulfamethoxazole-trimethoprim]; and Sulfa antibiotics   Review of Systems Review of Systems  Unable to perform ROS: Mental status change     Physical Exam Updated Vital Signs BP (!) 172/69   Pulse 77   Temp 98.2 F (36.8 C) (Oral)   Resp 14   SpO2 98%   Physical Exam  Constitutional: She appears well-developed.  Elderly, frail.  She is able to cooperate  with exam, and hands.  HENT:  Head: Normocephalic and atraumatic.  Eyes: Conjunctivae and EOM are normal. Pupils are equal, round, and reactive to light.  Neck: Normal range of motion and phonation normal. Neck supple.  Cardiovascular: Normal rate and regular rhythm.   Pulmonary/Chest: Effort normal and breath sounds normal. She exhibits no tenderness.  Abdominal: Soft. She exhibits no distension. There is no tenderness. There is no guarding.  Musculoskeletal: Normal range of motion. She exhibits edema (1+ bilateral lower legs.). She exhibits no tenderness or deformity.  Tender right low back, to light touch.  No spine step-off or deformity.  The patient is able to pull herself from supine to sitting, on the stretcher.  Neurological: She is alert. She exhibits normal muscle tone.  Skin: Skin is warm and dry.  Psychiatric: She has a normal mood and affect. Her behavior is normal.  Nursing note and vitals reviewed.    ED Treatments / Results  Labs (all labs ordered are listed, but only abnormal results are displayed) Labs Reviewed - No data to display  EKG  EKG Interpretation None       Radiology Dg Lumbar Spine Complete  Result Date: 04/22/2017 CLINICAL DATA:  Back pain.  No known injury . EXAM: LUMBAR SPINE - COMPLETE 4+ VIEW COMPARISON:  Chest x-ray 11/12/2016 . FINDINGS: Lumbar spine numbered with the lowest segmented appearing lumbar shaped vertebral lateral view as L5. Degenerative changes lumbar spine with scoliosis concave right. 3 mm anterolisthesis L4 on L5 . Prominent calcification in the pelvis most likely a fibroid. Calcified pelvic densities noted consistent with phleboliths. Aortoiliac atherosclerotic vascular disease. IMPRESSION: 1. Diffuse multilevel prominent degenerative change with 3 mm anterolisthesis L4 on L5. No acute bony abnormality. 2. Aortoiliac atherosclerotic vascular disease. Electronically Signed   By: Maisie Fushomas  Register   On: 04/22/2017 11:02     Procedures Procedures (including critical care time)  Medications Ordered in ED Medications  acetaminophen (TYLENOL) tablet 650 mg (650 mg Oral Given 04/22/17 1121)     Initial Impression / Assessment and Plan / ED Course  I have reviewed the triage vital signs and the nursing notes.  Pertinent labs & imaging results that were available during my care of the patient were reviewed by me and considered in my medical decision making (see chart for details).  Clinical Course as of Apr 22 1122  Fri Apr 22, 2017  1020 Reported back pain, with normal vital signs.  Suspect musculoskeletal.  Possibility for occult lumbar fracture so will image.  APAP for pain.  [EW]    Clinical Course User Index [EW] Mancel BaleWentz, Gianlucas Evenson, MD     Patient Vitals for the past 24 hrs:  BP Temp Temp src Pulse Resp SpO2  04/22/17 1120 (!) 172/69 - - 77 14 98 %  04/22/17 1005 (!) 163/65 98.2 F (36.8 C)  Oral 76 16 96 %  04/22/17 0959 - - - - - 95 %    11:23 AM Reevaluation with update and discussion. After initial assessment and treatment, an updated evaluation reveals no change in clinical status.  No one is with the patient at this time. Freeman Borba L    Final Clinical Impressions(s) / ED Diagnoses   Final diagnoses:  Low back pain, unspecified back pain laterality, unspecified chronicity, with sciatica presence unspecified  Spondylosis of lumbar region without myelopathy or radiculopathy    Evaluation is consistent with musculoskeletal pain.  Doubt causative UTI, fracture or myelopathy.  Nursing Notes Reviewed/ Care Coordinated Applicable Imaging Reviewed Interpretation of Laboratory Data incorporated into ED treatment  The patient appears reasonably screened and/or stabilized for discharge and I doubt any other medical condition or other [facility] requiring further screening, evaluation, or treatment in the ED at this time prior to discharge.  Plan: Home Medications- APAP for pain, continue usual  medications; Home Treatments-rest, heat; return here if the recommended treatment, does not improve the symptoms; Recommended follow up-PCP, as needed   New Prescriptions New Prescriptions   No medications on file     Mancel Bale, MD 04/22/17 1129

## 2017-04-25 ENCOUNTER — Inpatient Hospital Stay (HOSPITAL_COMMUNITY)
Admission: EM | Admit: 2017-04-25 | Discharge: 2017-04-28 | DRG: 689 | Disposition: A | Discharge status: Skilled Nursing Facility | Payer: Medicare Other | Attending: Internal Medicine | Admitting: Internal Medicine

## 2017-04-25 ENCOUNTER — Observation Stay (HOSPITAL_COMMUNITY): Payer: Medicare Other

## 2017-04-25 ENCOUNTER — Emergency Department (HOSPITAL_COMMUNITY): Payer: Medicare Other

## 2017-04-25 ENCOUNTER — Encounter (HOSPITAL_COMMUNITY): Payer: Self-pay | Admitting: Internal Medicine

## 2017-04-25 DIAGNOSIS — F0150 Vascular dementia without behavioral disturbance: Secondary | ICD-10-CM | POA: Diagnosis present

## 2017-04-25 DIAGNOSIS — N183 Chronic kidney disease, stage 3 (moderate): Secondary | ICD-10-CM | POA: Diagnosis present

## 2017-04-25 DIAGNOSIS — G92 Toxic encephalopathy: Secondary | ICD-10-CM | POA: Diagnosis not present

## 2017-04-25 DIAGNOSIS — W19XXXA Unspecified fall, initial encounter: Secondary | ICD-10-CM

## 2017-04-25 DIAGNOSIS — Z9109 Other allergy status, other than to drugs and biological substances: Secondary | ICD-10-CM

## 2017-04-25 DIAGNOSIS — N10 Acute tubulo-interstitial nephritis: Secondary | ICD-10-CM | POA: Diagnosis not present

## 2017-04-25 DIAGNOSIS — I10 Essential (primary) hypertension: Secondary | ICD-10-CM | POA: Diagnosis present

## 2017-04-25 DIAGNOSIS — E785 Hyperlipidemia, unspecified: Secondary | ICD-10-CM | POA: Diagnosis present

## 2017-04-25 DIAGNOSIS — Z881 Allergy status to other antibiotic agents status: Secondary | ICD-10-CM

## 2017-04-25 DIAGNOSIS — G929 Unspecified toxic encephalopathy: Secondary | ICD-10-CM | POA: Diagnosis present

## 2017-04-25 DIAGNOSIS — Z7982 Long term (current) use of aspirin: Secondary | ICD-10-CM

## 2017-04-25 DIAGNOSIS — I129 Hypertensive chronic kidney disease with stage 1 through stage 4 chronic kidney disease, or unspecified chronic kidney disease: Secondary | ICD-10-CM | POA: Diagnosis not present

## 2017-04-25 DIAGNOSIS — Z882 Allergy status to sulfonamides status: Secondary | ICD-10-CM

## 2017-04-25 DIAGNOSIS — Z66 Do not resuscitate: Secondary | ICD-10-CM | POA: Diagnosis not present

## 2017-04-25 DIAGNOSIS — Z87440 Personal history of urinary (tract) infections: Secondary | ICD-10-CM

## 2017-04-25 DIAGNOSIS — F0280 Dementia in other diseases classified elsewhere without behavioral disturbance: Secondary | ICD-10-CM | POA: Diagnosis present

## 2017-04-25 DIAGNOSIS — E876 Hypokalemia: Secondary | ICD-10-CM | POA: Diagnosis present

## 2017-04-25 DIAGNOSIS — F0390 Unspecified dementia without behavioral disturbance: Secondary | ICD-10-CM | POA: Diagnosis present

## 2017-04-25 DIAGNOSIS — E875 Hyperkalemia: Secondary | ICD-10-CM | POA: Diagnosis present

## 2017-04-25 DIAGNOSIS — R4182 Altered mental status, unspecified: Secondary | ICD-10-CM | POA: Diagnosis not present

## 2017-04-25 DIAGNOSIS — Z79899 Other long term (current) drug therapy: Secondary | ICD-10-CM

## 2017-04-25 DIAGNOSIS — E039 Hypothyroidism, unspecified: Secondary | ICD-10-CM | POA: Diagnosis not present

## 2017-04-25 DIAGNOSIS — J9811 Atelectasis: Secondary | ICD-10-CM | POA: Diagnosis not present

## 2017-04-25 DIAGNOSIS — G9340 Encephalopathy, unspecified: Secondary | ICD-10-CM | POA: Diagnosis present

## 2017-04-25 DIAGNOSIS — S0003XA Contusion of scalp, initial encounter: Secondary | ICD-10-CM | POA: Diagnosis not present

## 2017-04-25 DIAGNOSIS — R402411 Glasgow coma scale score 13-15, in the field [EMT or ambulance]: Secondary | ICD-10-CM | POA: Diagnosis not present

## 2017-04-25 LAB — I-STAT CHEM 8, ED
BUN: 23 mg/dL — ABNORMAL HIGH (ref 6–20)
CALCIUM ION: 1.2 mmol/L (ref 1.15–1.40)
CHLORIDE: 101 mmol/L (ref 101–111)
Creatinine, Ser: 1.6 mg/dL — ABNORMAL HIGH (ref 0.44–1.00)
GLUCOSE: 149 mg/dL — AB (ref 65–99)
HCT: 38 % (ref 36.0–46.0)
HEMOGLOBIN: 12.9 g/dL (ref 12.0–15.0)
POTASSIUM: 3.3 mmol/L — AB (ref 3.5–5.1)
Sodium: 140 mmol/L (ref 135–145)
TCO2: 29 mmol/L (ref 0–100)

## 2017-04-25 LAB — CBC WITH DIFFERENTIAL/PLATELET
BASOS ABS: 0 10*3/uL (ref 0.0–0.1)
BASOS PCT: 0 %
EOS ABS: 0.2 10*3/uL (ref 0.0–0.7)
Eosinophils Relative: 2 %
HCT: 39.5 % (ref 36.0–46.0)
HEMOGLOBIN: 13 g/dL (ref 12.0–15.0)
Lymphocytes Relative: 30 %
Lymphs Abs: 2.8 10*3/uL (ref 0.7–4.0)
MCH: 27.9 pg (ref 26.0–34.0)
MCHC: 32.9 g/dL (ref 30.0–36.0)
MCV: 84.8 fL (ref 78.0–100.0)
Monocytes Absolute: 0.7 10*3/uL (ref 0.1–1.0)
Monocytes Relative: 8 %
NEUTROS PCT: 60 %
Neutro Abs: 5.5 10*3/uL (ref 1.7–7.7)
Platelets: 312 10*3/uL (ref 150–400)
RBC: 4.66 MIL/uL (ref 3.87–5.11)
RDW: 15.2 % (ref 11.5–15.5)
WBC: 9.2 10*3/uL (ref 4.0–10.5)

## 2017-04-25 LAB — URINALYSIS, ROUTINE W REFLEX MICROSCOPIC
Bilirubin Urine: NEGATIVE
GLUCOSE, UA: NEGATIVE mg/dL
Hgb urine dipstick: NEGATIVE
Ketones, ur: NEGATIVE mg/dL
NITRITE: NEGATIVE
PH: 5 (ref 5.0–8.0)
PROTEIN: NEGATIVE mg/dL
Specific Gravity, Urine: 1.008 (ref 1.005–1.030)

## 2017-04-25 LAB — COMPREHENSIVE METABOLIC PANEL
ALT: 17 U/L (ref 14–54)
AST: 25 U/L (ref 15–41)
Albumin: 3.5 g/dL (ref 3.5–5.0)
Alkaline Phosphatase: 40 U/L (ref 38–126)
Anion gap: 7 (ref 5–15)
BUN: 21 mg/dL — ABNORMAL HIGH (ref 6–20)
CALCIUM: 9.4 mg/dL (ref 8.9–10.3)
CO2: 29 mmol/L (ref 22–32)
CREATININE: 1.61 mg/dL — AB (ref 0.44–1.00)
Chloride: 103 mmol/L (ref 101–111)
GFR, EST AFRICAN AMERICAN: 33 mL/min — AB (ref >60)
GFR, EST NON AFRICAN AMERICAN: 28 mL/min — AB (ref >60)
Glucose, Bld: 156 mg/dL — ABNORMAL HIGH (ref 65–99)
Potassium: 3.3 mmol/L — ABNORMAL LOW (ref 3.5–5.1)
Sodium: 139 mmol/L (ref 135–145)
Total Bilirubin: 0.5 mg/dL (ref 0.3–1.2)
Total Protein: 6.9 g/dL (ref 6.5–8.1)

## 2017-04-25 LAB — ETHANOL

## 2017-04-25 LAB — TSH: TSH: 3.011 u[IU]/mL (ref 0.350–4.500)

## 2017-04-25 LAB — BLOOD GAS, VENOUS
ACID-BASE EXCESS: 2.8 mmol/L — AB (ref 0.0–2.0)
Bicarbonate: 28.6 mmol/L — ABNORMAL HIGH (ref 20.0–28.0)
O2 Saturation: 40.6 %
PCO2 VEN: 51.1 mmHg (ref 44.0–60.0)
Patient temperature: 98.6
pH, Ven: 7.367 (ref 7.250–7.430)

## 2017-04-25 LAB — AMMONIA: Ammonia: 24 umol/L (ref 9–35)

## 2017-04-25 LAB — I-STAT CG4 LACTIC ACID, ED: LACTIC ACID, VENOUS: 1.26 mmol/L (ref 0.5–1.9)

## 2017-04-25 LAB — CBG MONITORING, ED: Glucose-Capillary: 117 mg/dL — ABNORMAL HIGH (ref 65–99)

## 2017-04-25 MED ORDER — BISACODYL 10 MG RE SUPP: 10.0000 mg | Freq: Every day | RECTAL | Status: DC | PRN

## 2017-04-25 MED ORDER — SODIUM CHLORIDE 0.9 % IV SOLN: INTRAVENOUS | Status: DC

## 2017-04-25 MED ORDER — EZETIMIBE-SIMVASTATIN 10-20 MG PO TABS
1.0000 | ORAL_TABLET | Freq: Every day | ORAL | Status: DC
  Administered 2017-04-25 – 2017-04-28 (×4): 1 via ORAL
  Filled 2017-04-25 (×4): qty 1

## 2017-04-25 MED ORDER — CLONIDINE HCL 0.1 MG PO TABS: 0.1000 mg | ORAL_TABLET | Freq: Every evening | ORAL | Status: DC

## 2017-04-25 MED ORDER — ASPIRIN 81 MG PO CHEW
81.0000 mg | CHEWABLE_TABLET | Freq: Every day | ORAL | Status: DC
  Administered 2017-04-27 – 2017-04-28 (×2): 81 mg via ORAL
  Filled 2017-04-25 (×3): qty 1

## 2017-04-25 MED ORDER — DOCUSATE SODIUM 100 MG PO CAPS
100.0000 mg | ORAL_CAPSULE | Freq: Every day | ORAL | Status: DC
  Administered 2017-04-25 – 2017-04-27 (×3): 100 mg via ORAL
  Filled 2017-04-25 (×3): qty 1

## 2017-04-25 MED ORDER — DIPHENHYDRAMINE HCL 50 MG/ML IJ SOLN
12.5000 mg | Freq: Four times a day (QID) | INTRAMUSCULAR | Status: DC | PRN
Start: 2017-04-25 — End: 2017-04-28

## 2017-04-25 MED ORDER — VITAMIN D3 25 MCG (1000 UT) PO CAPS: 1000.0000 [IU] | ORAL_CAPSULE | Freq: Every day | ORAL | Status: DC

## 2017-04-25 MED ORDER — ACETAMINOPHEN 650 MG RE SUPP: 650.0000 mg | Freq: Four times a day (QID) | RECTAL | Status: DC | PRN

## 2017-04-25 MED ORDER — HEPARIN SODIUM (PORCINE) 5000 UNIT/ML IJ SOLN: 5000.0000 [IU] | Freq: Three times a day (TID) | INTRAMUSCULAR | Status: DC

## 2017-04-25 MED ORDER — ALBUTEROL SULFATE (2.5 MG/3ML) 0.083% IN NEBU: 2.5000 mg | INHALATION_SOLUTION | RESPIRATORY_TRACT | Status: DC | PRN

## 2017-04-25 MED ORDER — ACETAMINOPHEN 325 MG PO TABS: 650.0000 mg | ORAL_TABLET | Freq: Four times a day (QID) | ORAL | Status: DC | PRN

## 2017-04-25 MED ORDER — ONDANSETRON HCL 4 MG/2ML IJ SOLN
4.0000 mg | Freq: Four times a day (QID) | INTRAMUSCULAR | Status: DC | PRN
  Administered 2017-04-27: 4 mg via INTRAVENOUS
  Filled 2017-04-25: qty 2

## 2017-04-25 MED ORDER — LEVOTHYROXINE SODIUM 25 MCG PO TABS
25.0000 ug | ORAL_TABLET | Freq: Every day | ORAL | Status: DC
  Administered 2017-04-25 – 2017-04-28 (×3): 25 ug via ORAL
  Filled 2017-04-25 (×3): qty 1

## 2017-04-25 MED ORDER — CEFTRIAXONE SODIUM 1 G IJ SOLR
1.0000 g | INTRAMUSCULAR | Status: DC
  Administered 2017-04-26: 1 g via INTRAVENOUS
  Filled 2017-04-25: qty 10

## 2017-04-25 MED ORDER — FENOFIBRATE 54 MG PO TABS
54.0000 mg | ORAL_TABLET | Freq: Every day | ORAL | Status: DC
  Administered 2017-04-25 – 2017-04-28 (×4): 54 mg via ORAL
  Filled 2017-04-25 (×4): qty 1

## 2017-04-25 MED ORDER — POTASSIUM CHLORIDE CRYS ER 20 MEQ PO TBCR
40.0000 meq | EXTENDED_RELEASE_TABLET | Freq: Once | ORAL | Status: AC
  Administered 2017-04-25: 40 meq via ORAL
  Filled 2017-04-25: qty 2

## 2017-04-25 MED ORDER — ACETAMINOPHEN 500 MG PO TABS: 500.0000 mg | ORAL_TABLET | Freq: Three times a day (TID) | ORAL | Status: DC | PRN

## 2017-04-25 MED ORDER — VITAMIN D3 25 MCG (1000 UNIT) PO TABS
1000.0000 [IU] | ORAL_TABLET | Freq: Every day | ORAL | Status: DC
  Administered 2017-04-25 – 2017-04-28 (×4): 1000 [IU] via ORAL
  Filled 2017-04-25 (×4): qty 1

## 2017-04-25 MED ORDER — AMLODIPINE BESYLATE 5 MG PO TABS
5.0000 mg | ORAL_TABLET | Freq: Every day | ORAL | Status: DC
  Administered 2017-04-25 – 2017-04-27 (×3): 5 mg via ORAL
  Filled 2017-04-25 (×3): qty 1

## 2017-04-25 MED ORDER — MEMANTINE HCL ER 28 MG PO CP24
28.0000 mg | ORAL_CAPSULE | Freq: Every day | ORAL | Status: DC
  Administered 2017-04-25 – 2017-04-28 (×4): 28 mg via ORAL
  Filled 2017-04-25 (×4): qty 1

## 2017-04-25 MED ORDER — DEXTROSE 5 % IV SOLN
1.0000 g | Freq: Once | INTRAVENOUS | Status: AC
  Administered 2017-04-25: 1 g via INTRAVENOUS
  Filled 2017-04-25: qty 10

## 2017-04-25 MED ORDER — ONDANSETRON HCL 4 MG PO TABS: 4.0000 mg | ORAL_TABLET | Freq: Four times a day (QID) | ORAL | Status: DC | PRN

## 2017-04-25 MED ORDER — HYDRALAZINE HCL 20 MG/ML IJ SOLN
10.0000 mg | Freq: Four times a day (QID) | INTRAMUSCULAR | Status: DC | PRN
  Filled 2017-04-25: qty 0.5

## 2017-04-25 NOTE — Clinical Social Work Note (Signed)
Clinical Social Work Assessment  Patient Details  Name: Kathryn Simmons MRN: [id number] Date of Birth: 05/25/1931  Date of referral:  04/25/17               Reason for consult:  Facility Placement                Permission sought to share information with:  Facility Medical sales representativeContact Representative Permission granted to share [phone] [email or web address]::  No  Name::        Agency::     Relationship::     Contact Information:     Housing/Transportation Living arrangements for the past 2 months:  Independent Living Facility Source of Information:  Patient Patient Interpreter Needed:  None Criminal Activity/Legal Involvement Pertinent to Current Situation/Hospitalization:    Significant Relationships:  Adult Children (Unknown) Lives with:  Facility Resident Do you feel safe going back to the place where you live?  Yes Need for family participation in patient care:  Yes (Comment) (Pt's son Kathryn Simmons at ph: [phone])  Care giving concerns:  Pt's son states he is legal guardian and POA and will being guardianship papers, as well as DNR and POA on 5/29.  Pt's son is Kathryn Simmons at ph: [phone] and states his brother (unnanmed) is not to be involved in medical/discharge decisions.   Social Worker assessment / plan:  CSW called and spoke with pt and with pt's son separately and confirmed pt's and pt's son's plan to be discharged [facility]  to [facility] at discharge.  Pt's son states he is not sure if pt should D/C back to [facility] as the pt was before, or should return [facility] Skilled Nursing or [facility].  CSW provided active listening and validated pt's concerns.   Pt's son did not yet give CSW Dept permission to complete FL-2 and send referrals out to SNF facilities via the hub per pt's request.  Pt [facility], [facility].  Employment status:   Retired Health and safety inspectornsurance information:  Medicare, Other (Comment Required) (Tricare supplement) PT Recommendations:  Not assessed at this time Information / Referral to community resources:     Patient/Family's Response to care: Patient not alert and oriented.  Patient's son agreeable to plan.  Pt's son supportive and strongly involved in pt.'s care.  Pt.'s son pleasant and appreciated CSW intervention.    Patient/Family's Understanding of and Emotional Response to Diagnosis, Current Treatment, and Prognosis:  Still assessing  Emotional Assessment Appearance:    Attitude/Demeanor/Rapport:  Unable to Assess Affect (typically observed):  Unable to Assess Orientation:  Fluctuating Orientation (Suspected and/or reported Sundowners) (Pt has dementia, Level V Caveat) Alcohol / Substance use:    Psych involvement (Current and /or in the community):     Discharge Needs  Concerns to be addressed:  No discharge needs identified Readmission within the last 30 days:  No Current discharge risk:  Cognitively Impaired Barriers to Discharge:  No Barriers Identified   Mercy RidingJonathan F Nikalas Bramel, LCSWA 04/25/2017, 10:25 PM

## 2017-04-25 NOTE — ED Provider Notes (Signed)
[facility]-EMERGENCY DEPT Provider Note   CSN: [id number] Arrival date & time: 04/25/17  1220     History   Chief Complaint Chief Complaint  Patient presents with  . Altered Mental Status    HPI [facility] is a 83 y.o. female.  83 yo F with a chief complaints of altered mental status. This morning the patient was noted to be extremely somnolent. Was unresponsive at the nursing home. They called 911 and sent her here. By her arrival the patient is asleep and able to awake to voice. Denies any complaints. Level V caveat altered mental status.  Patient's son is at bedside states that she has been doing fine until this morning. Was seen in the ED a few days ago with some back pain. He is not sure if this pain is changed or not. Denies any infectious symptoms. He is concerned that she has had multiple urinary tract infections in the past and thinks this may be the same.   The history is provided by the patient.  Altered Mental Status   This is a recurrent problem. The current episode started 3 to 5 hours ago. The problem has been gradually improving. Associated symptoms include confusion and somnolence. Her past medical history is significant for dementia. Her past medical history does not include seizures.  Illness  This is a new problem. The current episode started 3 to 5 hours ago. The problem occurs constantly. The problem has been gradually improving. Pertinent negatives include no chest pain, no headaches and no shortness of breath. Nothing aggravates the symptoms. Nothing relieves the symptoms. She has tried nothing for the symptoms. The treatment provided no relief.    Past Medical History:  Diagnosis Date  . Dementia   . Edema   . Hypertension   . Thyroid disease     Patient Active Problem List   Diagnosis Date Noted  . Hypothyroidism 04/25/2017  . 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 Encephalopathy 04/25/2017  . Hypertension   . Dementia     No past surgical history on  file.  OB History    No data available       Home Medications    Prior to Admission medications   Medication Sig Start Date End Date Taking? Authorizing Provider  acetaminophen (TYLENOL) 500 MG tablet Take 500 mg by mouth 3 (three) times daily as needed for moderate pain, fever or headache (max 3000mg  in 24 hours).   Yes [provider]  amLODipine (NORVASC) 5 MG tablet Take 5 mg by mouth daily.   Yes [provider]  aspirin 81 MG chewable tablet Chew 81 mg by mouth daily.   Yes [provider]  calcium carbonate (TUMS - DOSED IN MG ELEMENTAL CALCIUM) 500 MG chewable tablet Chew 1,000 mg by mouth 2 (two) times daily.   Yes [provider]  Cholecalciferol (VITAMIN D3) 1000 units CAPS Take 1,000 Units by mouth daily.   Yes [provider]  clindamycin (CLEOCIN) 150 MG capsule Take 500 mg by mouth daily as needed (to take 1 hour before procedure).   Yes [provider]  cloNIDine (CATAPRES) 0.1 MG tablet Take 0.1 mg by mouth every evening.   Yes [provider]  docusate sodium (COLACE) 100 MG capsule Take 100 mg by mouth at bedtime.   Yes [provider]  ezetimibe-simvastatin (VYTORIN) 10-20 MG tablet Take 1 tablet by mouth daily.   Yes [provider]  fenofibrate micronized (LOFIBRA) 67 MG capsule Take 67 mg  by mouth daily before breakfast.   Yes [provider]  furosemide (LASIX) 20 MG tablet Take 20 mg by mouth daily.   Yes [provider]  levothyroxine (SYNTHROID, LEVOTHROID) 25 MCG tablet Take 25 mcg by mouth daily before breakfast.   Yes [provider]  memantine (NAMENDA XR) 28 MG CP24 24 hr capsule Take 28 mg by mouth daily.   Yes [provider]  Multiple Vitamins-Minerals (PRESERVISION AREDS 2 PO) Take 1 capsule by mouth 2 (two) times daily.   Yes [provider]  ondansetron (ZOFRAN) 8 MG tablet Take 8 mg by mouth every 8 (eight) hours as needed for  nausea or vomiting.   Yes [provider]    Family History No family history on file.  Social History Social History  Substance Use Topics  . Smoking status: Never Smoker  . Smokeless tobacco: Never Used  . Alcohol use No     Allergies   Bee venom; Amoxicillin; Anaprox [naproxen]; Aricept [donepezil hcl]; Biaxin [clarithromycin]; Ciprofloxacin; Codeine; Keflex [cephalexin]; Levaquin [levofloxacin]; Lidocaine; Macrobid [nitrofurantoin macrocrystal]; Norflex [orphenadrine citrate]; Orphenadrine; Other; Septra [sulfamethoxazole-trimethoprim]; and Sulfa antibiotics   Review of Systems Review of Systems  Constitutional: Positive for activity change. Negative for chills and fever.  HENT: Negative for congestion and rhinorrhea.   Eyes: Negative for redness and visual disturbance.  Respiratory: Negative for shortness of breath and wheezing.   Cardiovascular: Negative for chest pain and palpitations.  Gastrointestinal: Negative for nausea and vomiting.  Genitourinary: Negative for dysuria and urgency.  Musculoskeletal: Negative for arthralgias and myalgias.  Skin: Negative for pallor and wound.  Neurological: Negative for dizziness and headaches.  Psychiatric/Behavioral: Positive for confusion.     Physical Exam Updated Vital Signs BP 128/61   Pulse (!) 58   Temp 97.3 F (36.3 C) (Rectal)   Resp 18   SpO2 93%   Physical Exam  Constitutional: She appears well-developed and well-nourished. No distress.  HENT:  Head: Normocephalic and atraumatic.  Eyes: EOM are normal. Pupils are equal, round, and reactive to light.  Neck: Normal range of motion. Neck supple.  Cardiovascular: Normal rate and regular rhythm.  Exam reveals no gallop and no friction rub.   No murmur heard. Pulmonary/Chest: Effort normal. She has no wheezes. She has no rales.  Abdominal: Soft. She exhibits no distension and no mass. There is no tenderness. There is no guarding.  Musculoskeletal: She  exhibits no edema or tenderness.  Neurological:  Sleepy, wakes to voice. Follows commands. No weakness noted to any extremity.  Skin: Skin is warm and dry. She is not diaphoretic.  Psychiatric: She has a normal mood and affect. Her behavior is normal.  Nursing note and vitals reviewed.    ED Treatments / Results  Labs (all labs ordered are listed, but only abnormal results are displayed) Labs Reviewed  COMPREHENSIVE METABOLIC PANEL - Abnormal; Notable for the following:       Result Value   Potassium 3.3 (*)    Glucose, Bld 156 (*)    BUN 21 (*)    Creatinine, Ser 1.61 (*)    GFR calc non Af Amer 28 (*)    GFR calc Af Amer 33 (*)    All other components within normal limits  URINALYSIS, ROUTINE W REFLEX MICROSCOPIC - Abnormal; Notable for the following:    APPearance HAZY (*)    Leukocytes, UA MODERATE (*)    Bacteria, UA MANY (*)    Squamous Epithelial / LPF 0-5 (*)  All other components within normal limits  BLOOD GAS, VENOUS - Abnormal; Notable for the following:    Bicarbonate 28.6 (*)    Acid-Base Excess 2.8 (*)    All other components within normal limits  I-STAT CHEM 8, ED - Abnormal; Notable for the following:    Potassium 3.3 (*)    BUN 23 (*)    Creatinine, Ser 1.60 (*)    Glucose, Bld 149 (*)    All other components within normal limits  CBG MONITORING, ED - Abnormal; Notable for the following:    Glucose-Capillary 117 (*)    All other components within normal limits  URINE CULTURE  AMMONIA  CBC WITH DIFFERENTIAL/PLATELET  ETHANOL  I-STAT CG4 LACTIC ACID, ED    EKG  EKG Interpretation None       Radiology Dg Chest 2 View  Result Date: 04/25/2017 CLINICAL DATA:  Lethargy, altered mental status, symptoms for a unknown duration, history dementia, hypertension EXAM: CHEST  2 VIEW COMPARISON:  Fall 15,017 FINDINGS: Upper normal size of cardiac silhouette. Mediastinal contours and pulmonary vascularity normal. Bibasilar atelectasis. Lungs otherwise  clear. No pleural effusion or pneumothorax. Bones unremarkable. IMPRESSION: Bibasilar atelectasis. Electronically Signed   By: Ulyses SouthwardMark  Boles M.D.   On: 04/25/2017 13:25   Ct Head Wo Contrast  Result Date: 04/25/2017 CLINICAL DATA:  The patient is very lethargic. EXAM: CT HEAD WITHOUT CONTRAST TECHNIQUE: Contiguous axial images were obtained from the base of the skull through the vertex without intravenous contrast. COMPARISON:  MRI December 03, 2016 FINDINGS: Brain: No subdural, epidural, or subarachnoid hemorrhage. Ventricles and sulci are prominent but unchanged. The cerebellum, brainstem, and basal cisterns are normal. Moderate to severe white matter changes are identified. No acute cortical ischemia or infarct. No mass, mass effect, or midline shift. Vascular: Calcified atherosclerosis identified in the intracranial carotid arteries. Skull: Normal. Negative for fracture or focal lesion. Sinuses/Orbits: Study limited due to motion. Within this limitation, no acute abnormalities seen in the paranasal sinuses, mastoid air cells, or middle ears. Other: None. IMPRESSION: 1. Moderate severe white matter changes. No acute intracranial abnormality. Electronically Signed   By: Gerome Samavid  Williams III M.D   On: 04/25/2017 13:52    Procedures Procedures (including critical care time)  Medications Ordered in ED Medications  cefTRIAXone (ROCEPHIN) 1 g in dextrose 5 % 50 mL IVPB (not administered)  diphenhydrAMINE (BENADRYL) injection 12.5 mg (not administered)  acetaminophen (TYLENOL) tablet 500 mg (not administered)  amLODipine (NORVASC) tablet 5 mg (not administered)  aspirin chewable tablet 81 mg (not administered)  Vitamin D3 CAPS 1,000 Units (not administered)  ezetimibe-simvastatin (VYTORIN) 10-20 MG per tablet 1 tablet (not administered)  levothyroxine (SYNTHROID, LEVOTHROID) tablet 25 mcg (not administered)  memantine (NAMENDA XR) 24 hr capsule 28 mg (not administered)  fenofibrate tablet 54 mg (not  administered)  docusate sodium (COLACE) capsule 100 mg (not administered)  hydrALAZINE (APRESOLINE) injection 10 mg (not administered)  potassium chloride SA (K-DUR,KLOR-CON) CR tablet 40 mEq (not administered)     Initial Impression / Assessment and Plan / ED Course  I have reviewed the triage vital signs and the nursing notes.  Pertinent labs & imaging results that were available during my care of the patient were reviewed by me and considered in my medical decision making (see chart for details).     83 yo F With a chief complaints of altered mental status. Patient woke up this morning and was somnolent. Had some improvement while she is in the ED. Wakes to  voice and follows commands. Son later states that she has had multiple episodes like this. They're thought to be allergic reactions. He is not sure exactly what the cause of it is. He thinks she was just started on a new eyedrop.  Evaluation with likely UTI. Will start on Rocephin.  The patients results and plan were reviewed and discussed.   Any x-rays performed were independently reviewed by myself.   Differential diagnosis were considered with the presenting HPI.  Medications  cefTRIAXone (ROCEPHIN) 1 g in dextrose 5 % 50 mL IVPB (not administered)  diphenhydrAMINE (BENADRYL) injection 12.5 mg (not administered)  acetaminophen (TYLENOL) tablet 500 mg (not administered)  amLODipine (NORVASC) tablet 5 mg (not administered)  aspirin chewable tablet 81 mg (not administered)  Vitamin D3 CAPS 1,000 Units (not administered)  ezetimibe-simvastatin (VYTORIN) 10-20 MG per tablet 1 tablet (not administered)  levothyroxine (SYNTHROID, LEVOTHROID) tablet 25 mcg (not administered)  memantine (NAMENDA XR) 24 hr capsule 28 mg (not administered)  fenofibrate tablet 54 mg (not administered)  docusate sodium (COLACE) capsule 100 mg (not administered)  hydrALAZINE (APRESOLINE) injection 10 mg (not administered)  potassium chloride SA  (K-DUR,KLOR-CON) CR tablet 40 mEq (not administered)    Vitals:   04/25/17 1345 04/25/17 1400 04/25/17 1407 04/25/17 1430  BP: (!) 144/70 134/68  128/61  Pulse: 66 62  (!) 58  Resp: (!) 23 13  18   Temp:   97.3 F (36.3 C)   TempSrc:   Rectal   SpO2: 95% 97%  93%    Final diagnoses:  Essential hypertension  Vascular dementia without behavioral disturbance    Admission/ observation were discussed with the admitting physician, patient and/or family and they are comfortable with the plan.    Final Clinical Impressions(s) / ED Diagnoses   Final diagnoses:  Essential hypertension  Vascular dementia without behavioral disturbance    New Prescriptions New Prescriptions   No medications on file     Melene Plan, DO 04/25/17 1600

## 2017-04-25 NOTE — Progress Notes (Signed)
   04/25/17 1845  What Happened  Was fall witnessed? No  Was patient injured? Yes (swollen area on head )  Patient found on floor  Found by Staff-comment Vernona Rieger(Laura NT)  Stated prior activity ambulating-unassisted  Follow Up  MD notified Thedore MinsSingh   Time MD notified [phone]  Family notified Yes-comment  Time family notified [id number]  Additional tests Yes-comment  Simple treatment Other (comment) (CT SCAN WAS ORDERED)  Adult Fall Risk Assessment  Risk Factor Category (scoring not indicated) High fall risk per protocol (document High fall risk)  Patient's Fall Risk High Fall Risk (>13 points)  Adult Fall Risk Interventions  Required Bundle Interventions *See Row Information* High fall risk - low, moderate, and high requirements implemented  Additional Interventions Use of appropriate toileting equipment (bedpan, BSC, etc.);Safety Sitter/Safety Rounder  Screening for Fall Injury Risk  Risk For Fall Injury- See Row Information  None identified  Pain Assessment  Pain Assessment No/denies pain  PCA/Epidural/Spinal Assessment  Respiratory Pattern Regular;Unlabored;No dyspnea  Neurological  Neuro (WDL) X  Level of Consciousness Alert  Orientation Level Oriented to place;Oriented to person;Disoriented to time;Disoriented to situation  Cognition Appropriate at baseline  Speech Clear  Pupil Assessment  No  Motor Function/Sensation Assessment Grip  R Hand Grip Moderate  L Hand Grip Moderate   RUE Motor Response Purposeful movement;Responds to commands  RUE Sensation Full sensation  LUE Motor Response Purposeful movement;Responds to commands  LUE Sensation Full sensation  RLE Motor Response Responds to commands  RLE Sensation Full sensation  LLE Motor Response Responds to commands  LLE Sensation Full sensation  Musculoskeletal  Musculoskeletal (WDL) X  Assistive Device None  Generalized Weakness Yes  Weight Bearing Restrictions No  Integumentary  Integumentary (WDL) X  RN Assisting with  Skin Assessment on Admission bABLEEN rn  Skin Color Appropriate for ethnicity  Skin Condition Dry  Skin Integrity Ecchymosis (from fall )  Ecchymosis Location Head  Ecchymosis Location Orientation Right

## 2017-04-25 NOTE — ED Notes (Signed)
Bed: [id number] Expected date:  Expected time:  Means of arrival:  Comments: EMS drowsiness

## 2017-04-25 NOTE — ED Triage Notes (Signed)
Per EMS, [facility] is [facility]. Staff called EMS after pt was found to be very lethargic. Staff is unaware of how long pt has been in current state. Pt son was visiting with pt this morning and reports that she was fine. Staff states that no medications were administered and pt medications were locked up. Per EMS pt responds to name and then goes back to sleep. Pt has a hx of dementia and hypertension.

## 2017-04-25 NOTE — H&P (Signed)
TRH H&P   Patient Demographics:    Kathryn ReeveVirginia Simmons, is a 83 y.o. female  MRN: [id number]   DOB - 07/11/1931  Admit Date - 04/25/2017  Outpatient Primary MD for the patient is Merlene LaughterStoneking, Hal, MD      Patient coming from: Independent Living  Chief Complaint  Patient presents with  . Altered Mental Status      HPI:    [name]  is a 83 y.o. female,  With H/O dementia diagnosed 2 yrs ago, CK D stage III, HTN,With thyroidism, who lives at assisted living facility, tends to develop confusion related to UTI brought in by son with confusion. According to the son 2 days ago she complained of some low back pain and was brought to the ER and was discharged after a normal back x-ray was obtained, she continued to get gradually more confused and was brought again today where workup showed mild UTI. Patient has no subjective complaints and I was called to admit the patient. She is currently back pain-free.    Review of systems:    In addition to the HPI above,  Currently negative review of systems No Fever-chills, No Headache, No changes with Vision or hearing, No problems swallowing food or Liquids, No Chest pain, Cough or Shortness of Breath, No Abdominal pain, No Nausea or Vommitting, Bowel movements are regular, No Blood in stool or Urine, No dysuria, No new skin rashes or bruises, No new joints pains-aches,  No new weakness, tingling, numbness in any extremity, No recent weight gain or loss, No polyuria, polydypsia or polyphagia, No significant Mental Stressors.  A full 10 point Review of Systems was done, except as stated above, all other Review of Systems were negative.   With Past History of the  following :    Past Medical History:  Diagnosis Date  . Dementia   . Edema   . Hypertension   . Thyroid disease       No past surgical history on file.    Social History:     Social History  Substance Use Topics  . Smoking status: Never Smoker  . Smokeless tobacco: Never Used  . Alcohol use No         Family History :   No family history of CAD at young age   Home Medications:  Prior to Admission medications   Medication Sig Start Date End Date Taking? Authorizing Provider  acetaminophen (TYLENOL) 500 MG tablet Take 500 mg by mouth 3 (three) times daily as needed for moderate pain, fever or headache (max 3000mg  in 24 hours).   Yes [provider]  amLODipine (NORVASC) 5 MG tablet Take 5 mg by mouth daily.   Yes [provider]  aspirin 81 MG chewable tablet Chew 81 mg by mouth daily.   Yes [provider]  calcium carbonate (TUMS - DOSED IN MG ELEMENTAL CALCIUM) 500 MG chewable tablet Chew 1,000 mg by mouth 2 (two) times daily.   Yes [provider]  Cholecalciferol (VITAMIN D3) 1000 units CAPS Take 1,000 Units by mouth daily.   Yes [provider]  clindamycin (CLEOCIN) 150 MG capsule Take 500 mg by mouth daily as needed (to take 1 hour before procedure).   Yes [provider]  cloNIDine (CATAPRES) 0.1 MG tablet Take 0.1 mg by mouth every evening.   Yes [provider]  docusate sodium (COLACE) 100 MG capsule Take 100 mg by mouth at bedtime.   Yes [provider]  ezetimibe-simvastatin (VYTORIN) 10-20 MG tablet Take 1 tablet by mouth daily.   Yes [provider]  fenofibrate micronized (LOFIBRA) 67 MG capsule Take 67 mg by mouth daily before breakfast.   Yes [provider]  furosemide (LASIX) 20 MG tablet Take 20 mg by mouth daily.   Yes [provider]  levothyroxine (SYNTHROID, LEVOTHROID) 25 MCG tablet Take 25 mcg by mouth daily before breakfast.   Yes [provider]  memantine (NAMENDA XR) 28 MG CP24 24 hr capsule Take 28 mg by mouth daily.   Yes [provider]  Multiple Vitamins-Minerals (PRESERVISION AREDS 2 PO) Take 1 capsule by mouth 2 (two) times daily.   Yes [provider]  ondansetron (ZOFRAN) 8 MG tablet Take 8 mg by mouth every 8 (eight) hours as needed for nausea or vomiting.   Yes [provider]     Allergies:     Allergies  Allergen Reactions  . Bee Venom Anaphylaxis  . Amoxicillin     Per MAR, reaction unknown  . Anaprox [Naproxen]     Per MAR, reaction unknown  . Aricept [Donepezil Hcl] Other (See Comments)    Unknown reaction  . Biaxin [Clarithromycin]     Per MAR, reaction unknown  . Ciprofloxacin     Per MAR, reaction unknown  . Codeine Other (See Comments)    Unknown reaction- on MAR  . Keflex [Cephalexin]     Per MAR, reaction unknown  . Levaquin [Levofloxacin]     Per MAR, reaction unknown  . Lidocaine Other (See Comments)    Unknown reaction- On MAR  . Macrobid [Nitrofurantoin Macrocrystal]     Per MAR, reaction unknown  . Norflex [Orphenadrine Citrate]     Per MAR, reaction unknown  . Orphenadrine Other (See Comments)    Unknown reaction- On MAR  . Other     Ants, anaphylaxis Son states pt has allergies to "msg" and other food additives and spicy foods and becomes unresponsive with these products  . Septra [Sulfamethoxazole-Trimethoprim]   . Sulfa Antibiotics Other (See Comments)    Unknown reaction- on MAR     Physical Exam:   Vitals  Blood pressure 128/61, pulse (!) 58, temperature 97.3 F (36.3 C), temperature source Rectal, resp. rate 18, SpO2 93 %.   1. General Pleasant elderly white female  lying in hospital bed in no apparent distress,  2. Normal affect and insight, Not Suicidal or Homicidal, she is awake and oriented to place only,  3. No F.N deficits, ALL C.Nerves Intact, Strength 5/5 all 4 extremities, Sensation intact all 4 extremities, Plantars  down going.  4. Ears and Eyes appear Normal, Conjunctivae clear, PERRLA. Moist Oral Mucosa.  5. Supple Neck, No JVD, No cervical lymphadenopathy appriciated, No Carotid Bruits.  6. Symmetrical Chest wall movement, Good air movement bilaterally, CTAB.  7. RRR, No Gallops, Rubs or Murmurs, No Parasternal Heave.  8. Positive Bowel Sounds, Abdomen Soft, No tenderness, No organomegaly appriciated,No rebound -guarding or rigidity.  9.  No Cyanosis, Normal Skin Turgor, No Skin Rash or Bruise.  10. Good muscle tone,  joints appear normal , no effusions, Normal ROM.  11. No Palpable Lymph Nodes in Neck or Axillae      Data Review:    CBC  Recent Labs Lab 04/25/17 1302 04/25/17 1314  WBC 9.2  --   HGB 13.0 12.9  HCT 39.5 38.0  PLT 312  --   MCV 84.8  --   MCH 27.9  --   MCHC 32.9  --   RDW 15.2  --   LYMPHSABS 2.8  --   MONOABS 0.7  --   EOSABS 0.2  --   BASOSABS 0.0  --    ------------------------------------------------------------------------------------------------------------------  Chemistries   Recent Labs Lab 04/25/17 1302 04/25/17 1314  NA 139 140  K 3.3* 3.3*  CL 103 101  CO2 29  --   GLUCOSE 156* 149*  BUN 21* 23*  CREATININE 1.61* 1.60*  CALCIUM 9.4  --   AST 25  --   ALT 17  --   ALKPHOS 40  --   BILITOT 0.5  --    ------------------------------------------------------------------------------------------------------------------ CrCl cannot be calculated (Unknown ideal weight.). ------------------------------------------------------------------------------------------------------------------ No results for input(s): TSH, T4TOTAL, T3FREE, THYROIDAB in the last 72 hours.  Invalid input(s): FREET3  Coagulation profile No results for input(s): INR, PROTIME in the last 168 hours. ------------------------------------------------------------------------------------------------------------------- No results for input(s): DDIMER in the last 72  hours. -------------------------------------------------------------------------------------------------------------------  Cardiac Enzymes No results for input(s): CKMB, TROPONINI, MYOGLOBIN in the last 168 hours.  Invalid input(s): CK ------------------------------------------------------------------------------------------------------------------ No results found for: BNP   ---------------------------------------------------------------------------------------------------------------  Urinalysis    Component Value Date/Time   COLORURINE YELLOW 04/25/2017 1259   APPEARANCEUR HAZY (A) 04/25/2017 1259   LABSPEC 1.008 04/25/2017 1259   PHURINE 5.0 04/25/2017 1259   GLUCOSEU NEGATIVE 04/25/2017 1259   HGBUR NEGATIVE 04/25/2017 1259   BILIRUBINUR NEGATIVE 04/25/2017 1259   KETONESUR NEGATIVE 04/25/2017 1259   PROTEINUR NEGATIVE 04/25/2017 1259   NITRITE NEGATIVE 04/25/2017 1259   LEUKOCYTESUR MODERATE (A) 04/25/2017 1259    ----------------------------------------------------------------------------------------------------------------   Imaging Results:    Dg Chest 2 View  Result Date: 04/25/2017 CLINICAL DATA:  Lethargy, altered mental status, symptoms for a unknown duration, history dementia, hypertension EXAM: CHEST  2 VIEW COMPARISON:  Fall 15,017 FINDINGS: Upper normal size of cardiac silhouette. Mediastinal contours and pulmonary vascularity normal. Bibasilar atelectasis. Lungs otherwise clear. No pleural effusion or pneumothorax. Bones unremarkable. IMPRESSION: Bibasilar atelectasis. Electronically Signed   By: Ulyses Southward M.D.   On: 04/25/2017 13:25   Ct Head Wo Contrast  Result Date: 04/25/2017 CLINICAL DATA:  The patient is very lethargic. EXAM: CT HEAD WITHOUT CONTRAST TECHNIQUE: Contiguous axial images were obtained from the base of the skull through the vertex without intravenous contrast. COMPARISON:  MRI December 03, 2016 FINDINGS: Brain: No subdural, epidural, or  subarachnoid hemorrhage. Ventricles and sulci are prominent but unchanged. The cerebellum, brainstem, and basal cisterns are normal. Moderate to severe white matter changes are identified. No acute cortical ischemia or infarct. No mass, mass effect, or midline shift. Vascular: Calcified atherosclerosis identified in the intracranial carotid arteries. Skull: Normal. Negative for fracture or focal lesion. Sinuses/Orbits: Study limited due to motion. Within this limitation, no acute abnormalities seen in the paranasal sinuses, mastoid air cells, or middle ears. Other: None. IMPRESSION: 1. Moderate severe white matter changes. No acute intracranial abnormality. Electronically Signed   By: Gerome Sam III M.D   On: 04/25/2017 13:52        Assessment & Plan:      1. Toxic encephalopathy causing delirium in a patient with moderate dementia due to UTI. Will be kept at 23 observation, IV fluids and IV antibiotics, follow urine cultures, monitor clinically. PT eval in the morning she currently lives in assisted living may require SNF.  2. CK D stage III. Creatinine close to baseline of 1.6 gently hydrate and monitor.  3. Hypokalemia. Replace.  4. Hypothyroidism. Continue home dose Synthroid check TSH.  5. Dyslipidemia. Continue home medications.   DVT Prophylaxis Heparin   AM Labs Ordered, also please review Full Orders  Family Communication: Admission, patients condition and plan of care including tests being ordered have been discussed with the patient and son who indicate understanding and agree with the plan and Code Status.  Code Status DNR  Likely DC to  TBD  Condition Fair  Consults called: None    Admission status: Obs    Time spent in minutes : 30   Susa Raring M.D on 04/25/2017 at 3:18 PM  Between 7am to 7pm - Pager - [phone] ( page via [facility], text pages only, please mention full 10 digit call back number).  After 7pm go to [email or web address] - password [email or web address]  [facility] - Office  [phone]

## 2017-04-26 ENCOUNTER — Encounter (HOSPITAL_COMMUNITY): Payer: Self-pay | Admitting: *Deleted

## 2017-04-26 DIAGNOSIS — N12 Tubulo-interstitial nephritis, not specified as acute or chronic: Secondary | ICD-10-CM | POA: Diagnosis not present

## 2017-04-26 DIAGNOSIS — Z66 Do not resuscitate: Secondary | ICD-10-CM | POA: Diagnosis present

## 2017-04-26 DIAGNOSIS — F0150 Vascular dementia without behavioral disturbance: Secondary | ICD-10-CM | POA: Diagnosis not present

## 2017-04-26 DIAGNOSIS — E785 Hyperlipidemia, unspecified: Secondary | ICD-10-CM | POA: Diagnosis present

## 2017-04-26 DIAGNOSIS — I10 Essential (primary) hypertension: Secondary | ICD-10-CM | POA: Diagnosis not present

## 2017-04-26 DIAGNOSIS — W19XXXD Unspecified fall, subsequent encounter: Secondary | ICD-10-CM | POA: Diagnosis not present

## 2017-04-26 DIAGNOSIS — Z87440 Personal history of urinary (tract) infections: Secondary | ICD-10-CM | POA: Diagnosis not present

## 2017-04-26 DIAGNOSIS — E876 Hypokalemia: Secondary | ICD-10-CM | POA: Diagnosis present

## 2017-04-26 DIAGNOSIS — Z7982 Long term (current) use of aspirin: Secondary | ICD-10-CM | POA: Diagnosis not present

## 2017-04-26 DIAGNOSIS — N183 Chronic kidney disease, stage 3 (moderate): Secondary | ICD-10-CM | POA: Diagnosis present

## 2017-04-26 DIAGNOSIS — E875 Hyperkalemia: Secondary | ICD-10-CM | POA: Diagnosis present

## 2017-04-26 DIAGNOSIS — Z881 Allergy status to other antibiotic agents status: Secondary | ICD-10-CM | POA: Diagnosis not present

## 2017-04-26 DIAGNOSIS — R4182 Altered mental status, unspecified: Secondary | ICD-10-CM | POA: Diagnosis not present

## 2017-04-26 DIAGNOSIS — Z9109 Other allergy status, other than to drugs and biological substances: Secondary | ICD-10-CM | POA: Diagnosis not present

## 2017-04-26 DIAGNOSIS — F0280 Dementia in other diseases classified elsewhere without behavioral disturbance: Secondary | ICD-10-CM | POA: Diagnosis present

## 2017-04-26 DIAGNOSIS — Z882 Allergy status to sulfonamides status: Secondary | ICD-10-CM | POA: Diagnosis not present

## 2017-04-26 DIAGNOSIS — N10 Acute tubulo-interstitial nephritis: Secondary | ICD-10-CM | POA: Diagnosis not present

## 2017-04-26 DIAGNOSIS — I129 Hypertensive chronic kidney disease with stage 1 through stage 4 chronic kidney disease, or unspecified chronic kidney disease: Secondary | ICD-10-CM | POA: Diagnosis present

## 2017-04-26 DIAGNOSIS — E039 Hypothyroidism, unspecified: Secondary | ICD-10-CM | POA: Diagnosis not present

## 2017-04-26 DIAGNOSIS — G92 Toxic encephalopathy: Secondary | ICD-10-CM | POA: Diagnosis not present

## 2017-04-26 DIAGNOSIS — Z79899 Other long term (current) drug therapy: Secondary | ICD-10-CM | POA: Diagnosis not present

## 2017-04-26 LAB — CBC
HCT: 37.8 % (ref 36.0–46.0)
Hemoglobin: 12.3 g/dL (ref 12.0–15.0)
MCH: 27.6 pg (ref 26.0–34.0)
MCHC: 32.5 g/dL (ref 30.0–36.0)
MCV: 84.8 fL (ref 78.0–100.0)
PLATELETS: 285 10*3/uL (ref 150–400)
RBC: 4.46 MIL/uL (ref 3.87–5.11)
RDW: 15.4 % (ref 11.5–15.5)
WBC: 8 10*3/uL (ref 4.0–10.5)

## 2017-04-26 LAB — BASIC METABOLIC PANEL
Anion gap: 6 (ref 5–15)
BUN: 19 mg/dL (ref 6–20)
CO2: 28 mmol/L (ref 22–32)
CREATININE: 1.38 mg/dL — AB (ref 0.44–1.00)
Calcium: 8.9 mg/dL (ref 8.9–10.3)
Chloride: 109 mmol/L (ref 101–111)
GFR calc Af Amer: 40 mL/min — ABNORMAL LOW (ref >60)
GFR, EST NON AFRICAN AMERICAN: 34 mL/min — AB (ref >60)
Glucose, Bld: 99 mg/dL (ref 65–99)
POTASSIUM: 3.5 mmol/L (ref 3.5–5.1)
SODIUM: 143 mmol/L (ref 135–145)

## 2017-04-26 LAB — MRSA PCR SCREENING: MRSA BY PCR: NEGATIVE

## 2017-04-26 MED ORDER — CALCIUM CARBONATE ANTACID 500 MG PO CHEW
1000.0000 mg | CHEWABLE_TABLET | Freq: Two times a day (BID) | ORAL | Status: DC
  Administered 2017-04-26 – 2017-04-28 (×5): 1000 mg via ORAL
  Filled 2017-04-26 (×5): qty 5

## 2017-04-26 MED ORDER — ONDANSETRON HCL 4 MG PO TABS: 8.0000 mg | ORAL_TABLET | Freq: Three times a day (TID) | ORAL | Status: DC | PRN

## 2017-04-26 NOTE — Progress Notes (Signed)
[facility] PROGRESS NOTE    Progress Note  Sharia Reeve  ZOX:[id number] DOB: [birth year] DOA: 04/25/2017 PCP: Merlene Laughter, MD     Brief Narrative:   Kathryn Simmons is an 83 y.o. female with past medical history of dementia, chronic disease stage III who resides in assisted living facility but developed confusion thought to be related to UTI, according to his on today's admission complaining of some low back pain was brought to the ED and was discharged, she gradually got more confused and was brought back to the ED.  Assessment/Plan:   Toxic encephalopathy: In the setting of dementia with a probable UTI, she was started empirically on IV Rocephin. IV fluids during cultures are pending. PT evaluation is pending.  Chronic kidney disease stage III: Creatinine at baseline. Continue hold Lasix.  Hyperkalemia: Repleted    DVT prophylaxis: lovenox Family Communication:none Disposition Plan/Barrier to D/C: Hopefully in the morning. Code Status:     Code Status Orders        Start     Ordered   04/25/17 1803  Do not attempt resuscitation (DNR)  Continuous    Question Answer Comment  In the event of cardiac or respiratory ARREST Do not call a "code blue"   In the event of cardiac or respiratory ARREST Do not perform Intubation, CPR, defibrillation or ACLS   In the event of cardiac or respiratory ARREST Use medication by any route, position, wound care, and other measures to relive pain and suffering. May use oxygen, suction and manual treatment of airway obstruction as needed for comfort.      04/25/17 1802    Code Status History    Date Active Date Inactive Code Status Order ID Comments User Context   This patient has a current code status but no historical code status.    Advance Directive Documentation     Most Recent Value  Type of Advance Directive  Living will, Healthcare Power of Whitney Muse a health care directive per pt's son]  Pre-existing out of  facility DNR order (yellow form or pink MOST form)  -  "MOST" Form in Place?  -        IV Access:    Peripheral IV   Procedures and diagnostic studies:   Dg Chest 2 View  Result Date: 04/25/2017 CLINICAL DATA:  Lethargy, altered mental status, symptoms for a unknown duration, history dementia, hypertension EXAM: CHEST  2 VIEW COMPARISON:  Fall 15,017 FINDINGS: Upper normal size of cardiac silhouette. Mediastinal contours and pulmonary vascularity normal. Bibasilar atelectasis. Lungs otherwise clear. No pleural effusion or pneumothorax. Bones unremarkable. IMPRESSION: Bibasilar atelectasis. Electronically Signed   By: Ulyses Southward M.D.   On: 04/25/2017 13:25   Ct Head Wo Contrast  Result Date: 04/25/2017 CLINICAL DATA:  Unwitnessed fall 1 hour ago EXAM: CT HEAD WITHOUT CONTRAST TECHNIQUE: Contiguous axial images were obtained from the base of the skull through the vertex without intravenous contrast. COMPARISON:  None. FINDINGS: BRAIN: There is chronic stable sulcal and ventricular prominence consistent with superficial and central atrophy. No intraparenchymal hemorrhage, mass effect nor midline shift. Periventricular and subcortical white matter hypodensities consistent with chronic small vessel ischemic disease are identified. No acute large vascular territory infarcts. No abnormal extra-axial fluid collections. Basal cisterns are not effaced and midline. VASCULAR: Moderate calcific atherosclerosis of the carotid siphons. SKULL: No skull fracture. No significant scalp soft tissue swelling. SINUSES/ORBITS: The mastoid air-cells are clear. The included paranasal sinuses are well-aerated.The included ocular globes and orbital contents  are non-suspicious. OTHER: Right frontal scalp hematoma. IMPRESSION: 1. Right frontal scalp hematoma.  No underlying skull fracture. 2. Chronic moderate to marked small vessel ischemic disease of periventricular and subcortical white matter. Cerebral atrophy. No acute  intracranial abnormality. Electronically Signed   By: Tollie Ethavid  Kwon M.D.   On: 04/25/2017 19:50   Ct Head Wo Contrast  Result Date: 04/25/2017 CLINICAL DATA:  The patient is very lethargic. EXAM: CT HEAD WITHOUT CONTRAST TECHNIQUE: Contiguous axial images were obtained from the base of the skull through the vertex without intravenous contrast. COMPARISON:  MRI December 03, 2016 FINDINGS: Brain: No subdural, epidural, or subarachnoid hemorrhage. Ventricles and sulci are prominent but unchanged. The cerebellum, brainstem, and basal cisterns are normal. Moderate to severe white matter changes are identified. No acute cortical ischemia or infarct. No mass, mass effect, or midline shift. Vascular: Calcified atherosclerosis identified in the intracranial carotid arteries. Skull: Normal. Negative for fracture or focal lesion. Sinuses/Orbits: Study limited due to motion. Within this limitation, no acute abnormalities seen in the paranasal sinuses, mastoid air cells, or middle ears. Other: None. IMPRESSION: 1. Moderate severe white matter changes. No acute intracranial abnormality. Electronically Signed   By: Gerome Samavid  Williams III M.D   On: 04/25/2017 13:52     Medical Consultants:    None.  Anti-Infectives:   [place]  Subjective:    Kathryn Simmons nonverbal  Objective:    Vitals:   04/25/17 1842 04/25/17 2024 04/26/17 0450 04/26/17 0800  BP: (!) 148/84 (!) 174/78 (!) 152/66 (!) 147/57  Pulse: 81 77 73 71  Resp:   18 17  Temp: 97.6 F (36.4 C) 98.4 F (36.9 C) 98.4 F (36.9 C)   TempSrc:  Oral Oral   SpO2: 98% 99% 97% 97%  Weight:   69.5 kg (153 lb 3.2 oz)    No intake or output data in the 24 hours ending 04/26/17 1036 Filed Weights   04/26/17 0450  Weight: 69.5 kg (153 lb 3.2 oz)    Exam: General exam: In no acute distress. Respiratory system: Good air movement and clear to auscultation. Cardiovascular system: S1 & S2 heard, RRR. No JVD. Gastrointestinal system: Abdomen is  nondistended, soft and Suprapubic tenderness. Central nervous system: Alert and oriented. No focal neurological deficits. Extremities: No pedal edema. Skin: No rashes, lesions or ulcers    Data Reviewed:    Labs: Basic Metabolic Panel:  Recent Labs Lab 04/25/17 1302 04/25/17 1314 04/26/17 0620  NA 139 140 143  K 3.3* 3.3* 3.5  CL 103 101 109  CO2 29  --  28  GLUCOSE 156* 149* 99  BUN 21* 23* 19  CREATININE 1.61* 1.60* 1.38*  CALCIUM 9.4  --  8.9   GFR CrCl cannot be calculated (Unknown ideal weight.). Liver Function Tests:  Recent Labs Lab 04/25/17 1302  AST 25  ALT 17  ALKPHOS 40  BILITOT 0.5  PROT 6.9  ALBUMIN 3.5   No results for input(s): LIPASE, AMYLASE in the last 168 hours.  Recent Labs Lab 04/25/17 1302  AMMONIA 24   Coagulation profile No results for input(s): INR, PROTIME in the last 168 hours.  CBC:  Recent Labs Lab 04/25/17 1302 04/25/17 1314 04/26/17 0620  WBC 9.2  --  8.0  NEUTROABS 5.5  --   --   HGB 13.0 12.9 12.3  HCT 39.5 38.0 37.8  MCV 84.8  --  84.8  PLT 312  --  285   Cardiac Enzymes: No results for input(s): CKTOTAL, CKMB,  CKMBINDEX, TROPONINI in the last 168 hours. BNP (last 3 results) No results for input(s): PROBNP in the last 8760 hours. CBG:  Recent Labs Lab 04/25/17 1403  GLUCAP 117*   D-Dimer: No results for input(s): DDIMER in the last 72 hours. Hgb A1c: No results for input(s): HGBA1C in the last 72 hours. Lipid Profile: No results for input(s): CHOL, HDL, LDLCALC, TRIG, CHOLHDL, LDLDIRECT in the last 72 hours. Thyroid function studies:  Recent Labs  04/25/17 1301  TSH 3.011   Anemia work up: No results for input(s): VITAMINB12, FOLATE, FERRITIN, TIBC, IRON, RETICCTPCT in the last 72 hours. Sepsis Labs:  Recent Labs Lab 04/25/17 1302 04/25/17 1314 04/26/17 0620  WBC 9.2  --  8.0  LATICACIDVEN  --  1.26  --    Microbiology Recent Results (from the past 240 hour(s))  MRSA PCR Screening      Status: None   Collection Time: 04/26/17  3:29 AM  Result Value Ref Range Status   MRSA by PCR NEGATIVE NEGATIVE Final    Comment:        The GeneXpert MRSA Assay (FDA approved for NASAL specimens only), is one component of a comprehensive MRSA colonization surveillance program. It is not intended to diagnose MRSA infection nor to guide or monitor treatment for MRSA infections.      Medications:   . amLODipine  5 mg Oral Daily  . aspirin  81 mg Oral Daily  . cholecalciferol  1,000 Units Oral Daily  . docusate sodium  100 mg Oral QHS  . ezetimibe-simvastatin  1 tablet Oral Daily  . fenofibrate  54 mg Oral Daily  . levothyroxine  25 mcg Oral QAC breakfast  . memantine  28 mg Oral Daily   Continuous Infusions: . sodium chloride    . cefTRIAXone (ROCEPHIN)  IV      Time spent: 25 min   LOS: 0 days   Marinda Elk  [facility] Pager [phone]  *Please refer to [email or web address], password [email or web address] to get updated schedule on who will round on this patient, as hospitalists switch teams weekly. If 7PM-7AM, please contact night-coverage at [email or web address], password [email or web address] for any overnight needs.  04/26/2017, 10:36 AM

## 2017-04-26 NOTE — Progress Notes (Signed)
CSW met with patient son-Legal Guardian. Patient son brought in Legal Guardian and HCPOA information for patient records. Patient son reports he is hopeful the patient can return to her independent living at [facility]. He reports prior to hospitalizations she was very independent and walking with no assistance. Patient son request to be updated once Physical Therapy has worked with patient.  CSW will continue to follow and assist with patient discharge.   Kathrin Greathouse, Latanya Presser, MSW Clinical Social Worker 5E and Psychiatric Service Line [phone] 04/26/2017  1:25 PM

## 2017-04-26 NOTE — Evaluation (Signed)
Physical Therapy Evaluation Patient Details Name: Kathryn Simmons MRN: [id number] DOB: 04/12/1931 Today's Date: 04/26/2017   History of Present Illness  Kathryn Simmons  is a 83 y.o. female,  With H/O dementia diagnosed 2 yrs ago, CK D stage III, HTN,With thyroidism, brought  in by son with confusion 04/25/17. Had been in Ed for back pain recently. found with mild UTI  Clinical Impression  The patient  ambulalted x 200' with Rw and min assist for balance. Initially patient was groggy, gradually improved Mentation and engaged in conversation about self/family. Recommend that patient have very close 24/7 supervision and assistance for ambulation. Pt admitted with above diagnosis. Pt currently with functional limitations due to the deficits listed below (see PT Problem List).  Pt will benefit from skilled PT to increase their independence and safety with mobility to allow discharge to the venue listed below.       Follow Up Recommendations Home health PT;SNF;Supervision/Assistance - 24 hour    Equipment Recommendations  None recommended by PT    Recommendations for Other Services       Precautions / Restrictions Precautions Precautions: Fall      Mobility  Bed Mobility Overal bed mobility: Needs Assistance Bed Mobility: Supine to Sit;Sit to Supine     Supine to sit: Min assist Sit to supine: Min guard   General bed mobility comments: extra time to rise  From bed, used rail to rise form  Toilet, no assistance to sit down  Transfers Overall transfer level: Needs assistance Equipment used: Rolling walker (2 wheeled) Transfers: Sit to/from Stand Sit to Stand: Min assist         General transfer comment: extra time to rise from bed and toilet  Ambulation/Gait Ambulation/Gait assistance: Min assist Ambulation Distance (Feet): 200 Feet Assistive device: Rolling walker (2 wheeled) Gait Pattern/deviations: Step-through pattern;Decreased stride length;Shuffle Gait velocity: decr    General Gait Details: initially gait  unsteady, did improve with distance.  die ambulate out of bathroom withot Rw and HHA with support for balance  Stairs            Wheelchair Mobility    Modified Rankin (Stroke Patients Only)       Balance Overall balance assessment: Needs assistance;History of Falls Sitting-balance support: Feet supported Sitting balance-Leahy Scale: Good Sitting balance - Comments: able to lean  to pull up socks   Standing balance support: During functional activity;No upper extremity supported Standing balance-Leahy Scale: Poor Standing balance comment: with Rw is fair                             Pertinent Vitals/Pain Pain Assessment: Faces Faces Pain Scale: Hurts a little bit Pain Location: right forhead Pain Descriptors / Indicators: Discomfort    Home Living Family/patient expects to be discharged to:: Skilled nursing facility                 Additional Comments: MSW notes reports patient in independent living, ambulates without assistance    Prior Function Level of Independence: Independent         Comments: per chart     Hand Dominance        Extremity/Trunk Assessment   Upper Extremity Assessment Upper Extremity Assessment: Generalized weakness    Lower Extremity Assessment Lower Extremity Assessment: Generalized weakness    Cervical / Trunk Assessment Cervical / Trunk Assessment: Normal  Communication   Communication: No difficulties  Cognition Arousal/Alertness: Awake/alert Behavior During Therapy:  Flat affect Overall Cognitive Status: No family/caregiver present to determine baseline cognitive functioning Area of Impairment: Orientation                 Orientation Level: Place;Time             General Comments: history of dementia, oriented to self      General Comments      Exercises     Assessment/Plan    PT Assessment Patient needs continued PT services  PT Problem  List Decreased range of motion       PT Treatment Interventions DME instruction;Gait training;Functional mobility training;Therapeutic activities;Balance training;Cognitive remediation;Patient/family education    PT Goals (Current goals can be found in the Care Plan section)  Acute Rehab PT Goals Patient Stated Goal: pt unable  PT Goal Formulation: Patient unable to participate in goal setting Time For Goal Achievement: 05/03/17 Potential to Achieve Goals: Good    Frequency Min 3X/week   Barriers to discharge        Co-evaluation               AM-PAC PT "6 Clicks" Daily Activity  Outcome Measure Difficulty turning over in bed (including adjusting bedclothes, sheets and blankets)?: A Little Difficulty moving from lying on back to sitting on the side of the bed? : A Little Difficulty sitting down on and standing up from a chair with arms (e.g., wheelchair, bedside commode, etc,.)?: A Little Help needed moving to and from a bed to chair (including a wheelchair)?: A Little Help needed walking in hospital room?: A Little Help needed climbing 3-5 steps with a railing? : A Lot 6 Click Score: 17    End of Session Equipment Utilized During Treatment: Gait belt Activity Tolerance: Patient tolerated treatment well Patient left: in bed;with call bell/phone within reach;with bed alarm set;Other (comment) (mobilie monitor) Nurse Communication: Mobility status PT Visit Diagnosis: Unsteadiness on feet (R26.81);History of falling (Z91.81)    Time: 1610-9604 PT Time Calculation (min) (ACUTE ONLY): 20 min   Charges:   PT Evaluation $PT Eval Low Complexity: 1 Procedure     PT G Codes:   PT G-Codes **NOT FOR INPATIENT CLASS** Functional Assessment Tool Used: AM-PAC 6 Clicks Basic Mobility;Clinical judgement Functional Limitation: Mobility: Walking and moving around Mobility: Walking and Moving Around Current Status (V4098): At least 40 percent but less than 60 percent impaired,  limited or restricted Mobility: Walking and Moving Around Goal Status [phone]): 0 percent impaired, limited or restricted     Rada Hay 04/26/2017, 4:06 PM Blanchard Kelch PT [phone]

## 2017-04-27 DIAGNOSIS — E039 Hypothyroidism, unspecified: Secondary | ICD-10-CM

## 2017-04-27 DIAGNOSIS — G92 Toxic encephalopathy: Secondary | ICD-10-CM

## 2017-04-27 DIAGNOSIS — N10 Acute tubulo-interstitial nephritis: Secondary | ICD-10-CM

## 2017-04-27 DIAGNOSIS — W19XXXD Unspecified fall, subsequent encounter: Secondary | ICD-10-CM

## 2017-04-27 DIAGNOSIS — I10 Essential (primary) hypertension: Secondary | ICD-10-CM

## 2017-04-27 DIAGNOSIS — F0150 Vascular dementia without behavioral disturbance: Secondary | ICD-10-CM

## 2017-04-27 LAB — URINE CULTURE: Culture: >=100000 — AB

## 2017-04-27 MED ORDER — FUROSEMIDE 20 MG PO TABS: 20.0000 mg | ORAL_TABLET | Freq: Every day | ORAL | Status: AC | PRN

## 2017-04-27 MED ORDER — CEPHALEXIN 500 MG PO CAPS
500.0000 mg | ORAL_CAPSULE | Freq: Two times a day (BID) | ORAL | 0 refills | Status: AC
Start: 2017-04-27 — End: ?

## 2017-04-27 MED ORDER — CLONIDINE HCL 0.1 MG PO TABS
0.1000 mg | ORAL_TABLET | Freq: Every evening | ORAL | Status: DC
  Administered 2017-04-27: 0.1 mg via ORAL
  Filled 2017-04-27: qty 1

## 2017-04-27 MED ORDER — POTASSIUM CHLORIDE ER 10 MEQ PO TBCR: 10.0000 meq | EXTENDED_RELEASE_TABLET | Freq: Every day | ORAL | Status: AC | PRN

## 2017-04-27 MED ORDER — CEPHALEXIN 500 MG PO CAPS
500.0000 mg | ORAL_CAPSULE | Freq: Two times a day (BID) | ORAL | Status: DC
  Administered 2017-04-27 – 2017-04-28 (×3): 500 mg via ORAL
  Filled 2017-04-27 (×3): qty 1

## 2017-04-27 NOTE — NC FL2 (Signed)
[place] MEDICAID FL2 LEVEL OF CARE SCREENING TOOL     IDENTIFICATION  Patient Name: Kathryn Simmons Birthdate: 05/28/1931 Sex: female Admission Date (Current Location): 04/25/2017  [facility] [place]   Facility and Address:  [facility],  [address], [place] [id number]      Provider Number: [id number]  Attending Physician Name and Address:  Calvert Cantorizwan, Saima, MD  Relative Name and Phone Number:  Ezequiel KayserBenbow,Tom [phone]    Current Level of Care: Hospital Recommended Level of Care: Skilled Nursing Facility Prior Approval Number:    Date Approved/Denied:   PASRR Number: [id number]  Discharge Plan: SNF    Current Diagnoses: Patient Active Problem List   Diagnosis Date Noted  . Hypothyroidism 04/25/2017  . 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 Encephalopathy 04/25/2017  . Hypertension   . Dementia     Orientation RESPIRATION BLADDER Height & Weight     Self, Place  Normal Incontinent Weight: 151 lb 9.6 oz (68.8 kg) Height:  5\' 5"  (165.1 cm)  BEHAVIORAL SYMPTOMS/MOOD NEUROLOGICAL BOWEL NUTRITION STATUS      Continent Diet (Soft Diet)  AMBULATORY STATUS COMMUNICATION OF NEEDS Skin   Limited Assist Verbally Normal                       Personal Care Assistance Level of Assistance  Bathing, Feeding, Dressing Bathing Assistance: Limited assistance Feeding assistance: Independent Dressing Assistance: Limited assistance     Functional Limitations Info  Sight, Hearing, Speech Sight Info: Adequate Hearing Info: Adequate Speech Info: Adequate    SPECIAL CARE FACTORS FREQUENCY  PT (By licensed PT)     PT Frequency: Min 3X/week              Contractures Contractures Info: Not present    Additional Factors Info  Code Status, Allergies Code Status Info: DNR Allergies Info: Allergies:  Bee Venom, Amoxicillin,Sulfa Antibiotics,Septra Sulfamethoxazole-trimethoprim,Orphenadrine,Norflex Orphenadrine Citrate,            Current Medications (04/27/2017):  This is the current hospital active medication list Current Facility-Administered Medications  Medication Dose Route Frequency Provider Last Rate Last Dose  . acetaminophen (TYLENOL) tablet 650 mg  650 mg Oral Q6H PRN Leroy SeaSingh, Prashant K, MD       Or  . acetaminophen (TYLENOL) suppository 650 mg  650 mg Rectal Q6H PRN Leroy SeaSingh, Prashant K, MD      . albuterol (PROVENTIL) (2.5 MG/3ML) 0.083% nebulizer solution 2.5 mg  2.5 mg Nebulization Q4H PRN Leroy SeaSingh, Prashant K, MD      . amLODipine (NORVASC) tablet 5 mg  5 mg Oral Daily Leroy SeaSingh, Prashant K, MD   5 mg at 04/27/17 1032  . aspirin chewable tablet 81 mg  81 mg Oral Daily Leroy SeaSingh, Prashant K, MD   81 mg at 04/27/17 1032  . bisacodyl (DULCOLAX) suppository 10 mg  10 mg Rectal Daily PRN Leroy SeaSingh, Prashant K, MD      . calcium carbonate (TUMS - dosed in mg elemental calcium) chewable tablet 1,000 mg  1,000 mg Oral BID Marinda ElkFeliz Ortiz, Abraham, MD   1,000 mg at 04/27/17 1033  . cephALEXin (KEFLEX) capsule 500 mg  500 mg Oral Q12H Rizwan, Ladell HeadsSaima, MD      . cholecalciferol (VITAMIN D) tablet 1,000 Units  1,000 Units Oral Daily Leroy SeaSingh, Prashant K, MD   1,000 Units at 04/27/17 1033  . cloNIDine (CATAPRES) tablet 0.1 mg  0.1 mg Oral QPM Calvert Cantorizwan, Saima, MD      . diphenhydrAMINE (  BENADRYL) injection 12.5 mg  12.5 mg Intravenous Q6H PRN Leroy Sea, MD      . docusate sodium (COLACE) capsule 100 mg  100 mg Oral QHS Leroy Sea, MD   100 mg at 04/26/17 2204  . ezetimibe-simvastatin (VYTORIN) 10-20 MG per tablet 1 tablet  1 tablet Oral Daily Leroy Sea, MD   1 tablet at 04/27/17 1032  . fenofibrate tablet 54 mg  54 mg Oral Daily Leroy Sea, MD   54 mg at 04/27/17 1032  . hydrALAZINE (APRESOLINE) injection 10 mg  10 mg Intravenous Q6H PRN Leroy Sea, MD      . levothyroxine (SYNTHROID, LEVOTHROID) tablet 25 mcg  25 mcg Oral QAC breakfast Leroy Sea, MD   25 mcg at 04/26/17 1154  . memantine (NAMENDA XR)  24 hr capsule 28 mg  28 mg Oral Daily Leroy Sea, MD   28 mg at 04/27/17 1032  . ondansetron (ZOFRAN) tablet 4 mg  4 mg Oral Q6H PRN Leroy Sea, MD       Or  . ondansetron [facility]) injection 4 mg  4 mg Intravenous Q6H PRN Leroy Sea, MD      . ondansetron [facility]) tablet 8 mg  8 mg Oral Q8H PRN Marinda Elk, MD         Discharge Medications: Please see discharge summary for a list of discharge medications.  Relevant Imaging Results:  Relevant Lab Results:   Additional Information ssn:[id number]/Patient has legal Guardian  Clearance Coots, LCSW

## 2017-04-27 NOTE — Discharge Summary (Addendum)
Physician Discharge Summary  Sharia Reeve WUJ:[id number] DOB: Feb 11, 1931 DOA: 04/25/2017  PCP: Merlene Laughter, MD  Admit date: 04/25/2017 Discharge date: 04/28/2017  Admitted From: independent living Disposition:  SNF   Recommendations for Outpatient Follow-up:  1. Urology follow up in 1 wk 2. TFTs in 1 month 3. D/c Keflex after evening dose on 6/6 4. Bathe patient in the bed (sponge bath) if she refuses to get into the shower 5. Daily weights 6. Give the PRN dose of Lasix if there is a weight gain of 3 lb in 24-48 hrs- give KCL with it 7. Norvasc dose has been increased from 5 mg to 10 mg daily- follow BP and adjust as needed    Discharge Condition:  stable   CODE STATUS:  DNR   Diet recommendation:  Heart healthy Consultations:  none    Discharge Diagnoses:  Principal Problem:   Acute pyelonephritis Active Problems:   Toxic encephalopathy   Hypertension   Dementia   Hypothyroidism    Subjective: No complaints.   Brief Summary: 83 y/o female with dementia, CKD 3, HTN, hypothyroid who lives at independent living. She complained of lower back pain on 5/25 and was sent in to the ER. Work up was unrevealing and she was discharged after being given Tylenol. She was noted to be increasingly sleepy after this which prompted the facility to send her back to the hospital. Earlier on in the week, the son had noted mild changes in her mental status which she has had in the past with UTIs. In the ER she was noted to have a positive UA and was admitted for treatment of a UTI and associated acute encephalopathy.   Hospital Course:  Pyelonephritis - the family (son and daughter in law) feels that she has frequent UTIs - they state she has issues with hygiene and does not allow the nurses to bath her which likely predisposes her to UTIs - son states she had a urology assessment last fall and apparently she had a great deal of "scar tissue" in her bladder as well- no h/o urological  procedures - as she presented initially with back pain, I will treat this as pyelonephritis - started on Rocephin and transitioned to Cephalexin based on sensitivities - will treat for total of 10 days - recommend a urology f/u in 1 wk  Acute encephalopathy - with underlying dementia - slowly recovering with improvement in lethargy  - she is waking up for meals and accoridng to family, is eating and drinking at baseline   Fall/ generalized weakness - fell while trying to get out of bed without assistance in the hospital - will be going to SNF for rehab - small hematoma on right frontal area is stable- using Tylenol for pain  Renal insufficiency on CKD 3 - Cr 1.57 on admission - now 1.38 - likely Pre-renal- she is on Lasix at home- will recommend that it be used PRN for weight gain (will be going to SNF where she can be monitored for weight gain daily)-  I see no h/o CHF on her chart  HTN - uncontrolled- have had to double Norvasc  Hypothyroid - abnormal TFTs could represent sick euthyroid syndrome due to current pyelonephritis - cont Synthroid at current dose and obtain TFTs as outpt in 3-4 weeks    Discharge Instructions  Discharge Instructions    Diet - low sodium heart healthy    Complete by:  As directed    Discharge instructions    Complete  by:  As directed    Daily weights Give the PRN dose of Lasix if there is a weight gain of 3 lb in 24-48 hrs- give KCL with it   Increase activity slowly    Complete by:  As directed      Allergies as of 04/28/2017      Reactions   Bee Venom Anaphylaxis   Amoxicillin    Per MAR, reaction unknown Has patient had a PCN reaction causing immediate rash, facial/tongue/throat swelling, SOB or lightheadedness with hypotension: unknown Has patient had a PCN reaction causing severe rash involving mucus membranes or skin necrosis:unknown Has patient had a PCN reaction that required hospitalization: unknown Has patient had a PCN reaction  occurring within the last 10 years: unknown If all of the above answers are "NO", then may proceed with Cephalosporin use.   Anaprox [naproxen]    Per MAR, reaction unknown   Aricept [donepezil Hcl] Other (See Comments)   Unknown reaction   Biaxin [clarithromycin]    Per MAR, reaction unknown   Ciprofloxacin    Per MAR, reaction unknown   Codeine Other (See Comments)   Unknown reaction- on MAR   Keflex [cephalexin]    Per MAR, reaction unknown   Levaquin [levofloxacin]    Per MAR, reaction unknown   Lidocaine Other (See Comments)   Unknown reaction- On MAR   Macrobid [nitrofurantoin Macrocrystal]    Per MAR, reaction unknown   Norflex [orphenadrine Citrate]    Per MAR, reaction unknown   Orphenadrine Other (See Comments)   Unknown reaction- On MAR   Other    Ants, anaphylaxis Son states pt has allergies to "msg" and other food additives and spicy foods and becomes unresponsive with these products   Septra [sulfamethoxazole-trimethoprim]    Sulfa Antibiotics Other (See Comments)   Unknown reaction- on MAR      Medication List    TAKE these medications   acetaminophen 500 MG tablet Commonly known as:  TYLENOL Take 500 mg by mouth 3 (three) times daily as needed for moderate pain, fever or headache (max 3000mg  in 24 hours).   amLODipine 10 MG tablet Commonly known as:  NORVASC Take 1 tablet (10 mg total) by mouth daily. What changed:  medication strength  how much to take   aspirin 81 MG chewable tablet Chew 81 mg by mouth daily.   calcium carbonate 500 MG chewable tablet Commonly known as:  TUMS - dosed in mg elemental calcium Chew 1,000 mg by mouth 2 (two) times daily.   cephALEXin 500 MG capsule Commonly known as:  KEFLEX Take 1 capsule (500 mg total) by mouth every 12 (twelve) hours.   clindamycin 150 MG capsule Commonly known as:  CLEOCIN Take 500 mg by mouth daily as needed (to take 1 hour before procedure).   cloNIDine 0.1 MG tablet Commonly known  as:  CATAPRES Take 0.1 mg by mouth every evening.   docusate sodium 100 MG capsule Commonly known as:  COLACE Take 100 mg by mouth at bedtime.   ezetimibe-simvastatin 10-20 MG tablet Commonly known as:  VYTORIN Take 1 tablet by mouth daily.   fenofibrate micronized 67 MG capsule Commonly known as:  LOFIBRA Take 67 mg by mouth daily before breakfast.   furosemide 20 MG tablet Commonly known as:  LASIX Take 1 tablet (20 mg total) by mouth daily as needed. What changed:  when to take this  reasons to take this   levothyroxine 25 MCG tablet Commonly known as:  SYNTHROID, LEVOTHROID Take 25 mcg by mouth daily before breakfast.   NAMENDA XR 28 MG Cp24 24 hr capsule Generic drug:  memantine Take 28 mg by mouth daily.   ondansetron 8 MG tablet Commonly known as:  ZOFRAN Take 8 mg by mouth every 8 (eight) hours as needed for nausea or vomiting.   potassium chloride 10 MEQ tablet Commonly known as:  K-DUR Take 1 tablet (10 mEq total) by mouth daily as needed. With Lasix   PRESERVISION AREDS 2 PO Take 1 capsule by mouth 2 (two) times daily.   Vitamin D3 1000 units Caps Take 1,000 Units by mouth daily.       Contact information for follow-up providers    [facility] Follow up on 05/04/2017.   Why:  Jetta Lout, NP @ 11:00 am Contact information: [address] [place] [address] [phone]           Contact information for after-discharge care    Destination    [name] Follow up.   Specialty:  Skilled Nursing Facility Contact information: [address] [place] [address] [phone]                 Allergies  Allergen Reactions  . Bee Venom Anaphylaxis  . Amoxicillin     Per MAR, reaction unknown Has patient had a PCN reaction causing immediate rash, facial/tongue/throat swelling, SOB or lightheadedness with hypotension: unknown Has patient had a PCN reaction causing severe rash  involving mucus membranes or skin necrosis:unknown Has patient had a PCN reaction that required hospitalization: unknown Has patient had a PCN reaction occurring within the last 10 years: unknown If all of the above answers are "NO", then may proceed with Cephalosporin use.  Marland Kitchen Anaprox [Naproxen]     Per MAR, reaction unknown  . Aricept [Donepezil Hcl] Other (See Comments)    Unknown reaction  . Biaxin [Clarithromycin]     Per MAR, reaction unknown  . Ciprofloxacin     Per MAR, reaction unknown  . Codeine Other (See Comments)    Unknown reaction- on MAR  . Keflex [Cephalexin]     Per MAR, reaction unknown  . Levaquin [Levofloxacin]     Per MAR, reaction unknown  . Lidocaine Other (See Comments)    Unknown reaction- On MAR  . Macrobid [Nitrofurantoin Macrocrystal]     Per MAR, reaction unknown  . Norflex [Orphenadrine Citrate]     Per MAR, reaction unknown  . Orphenadrine Other (See Comments)    Unknown reaction- On MAR  . Other     Ants, anaphylaxis Son states pt has allergies to "msg" and other food additives and spicy foods and becomes unresponsive with these products  . Septra [Sulfamethoxazole-Trimethoprim]   . Sulfa Antibiotics Other (See Comments)    Unknown reaction- on MAR     Procedures/Studies:   Dg Chest 2 View  Result Date: 04/25/2017 CLINICAL DATA:  Lethargy, altered mental status, symptoms for a unknown duration, history dementia, hypertension EXAM: CHEST  2 VIEW COMPARISON:  Fall 15,017 FINDINGS: Upper normal size of cardiac silhouette. Mediastinal contours and pulmonary vascularity normal. Bibasilar atelectasis. Lungs otherwise clear. No pleural effusion or pneumothorax. Bones unremarkable. IMPRESSION: Bibasilar atelectasis. Electronically Signed   By: Ulyses Southward M.D.   On: 04/25/2017 13:25   Dg Lumbar Spine Complete  Result Date: 04/22/2017 CLINICAL DATA:  Back pain.  No known injury . EXAM: LUMBAR SPINE - COMPLETE 4+ VIEW COMPARISON:  Chest x-ray  11/12/2016 . FINDINGS: Lumbar spine numbered with the lowest segmented appearing lumbar shaped vertebral lateral view as L5. Degenerative changes lumbar spine with scoliosis concave right. 3 mm anterolisthesis L4 on L5 . Prominent calcification in the pelvis most likely a fibroid. Calcified pelvic densities noted consistent with phleboliths. Aortoiliac atherosclerotic vascular disease. IMPRESSION: 1. Diffuse multilevel prominent degenerative change with 3 mm anterolisthesis L4 on L5. No acute bony abnormality. 2. Aortoiliac atherosclerotic vascular disease. Electronically Signed   By: Maisie Fus  Register   On: 04/22/2017 11:02   Ct Head Wo Contrast  Result Date: 04/25/2017 CLINICAL DATA:  Unwitnessed fall 1 hour ago EXAM: CT HEAD WITHOUT CONTRAST TECHNIQUE: Contiguous axial images were obtained from the base of the skull through the vertex without intravenous contrast. COMPARISON:  None. FINDINGS: BRAIN: There is chronic stable sulcal and ventricular prominence consistent with superficial and central atrophy. No intraparenchymal hemorrhage, mass effect nor midline shift. Periventricular and subcortical white matter hypodensities consistent with chronic small vessel ischemic disease are identified. No acute large vascular territory infarcts. No abnormal extra-axial fluid collections. Basal cisterns are not effaced and midline. VASCULAR: Moderate calcific atherosclerosis of the carotid siphons. SKULL: No skull fracture. No significant scalp soft tissue swelling. SINUSES/ORBITS: The mastoid air-cells are clear. The included paranasal sinuses are well-aerated.The included ocular globes and orbital contents are non-suspicious. OTHER: Right frontal scalp hematoma. IMPRESSION: 1. Right frontal scalp hematoma.  No underlying skull fracture. 2. Chronic moderate to marked small vessel ischemic disease of periventricular and subcortical white matter. Cerebral atrophy. No acute intracranial abnormality. Electronically Signed    By: Tollie Eth M.D.   On: 04/25/2017 19:50   Ct Head Wo Contrast  Result Date: 04/25/2017 CLINICAL DATA:  The patient is very lethargic. EXAM: CT HEAD WITHOUT CONTRAST TECHNIQUE: Contiguous axial images were obtained from the base of the skull through the vertex without intravenous contrast. COMPARISON:  MRI December 03, 2016 FINDINGS: Brain: No subdural, epidural, or subarachnoid hemorrhage. Ventricles and sulci are prominent but unchanged. The cerebellum, brainstem, and basal cisterns are normal. Moderate to severe white matter changes are identified. No acute cortical ischemia or infarct. No mass, mass effect, or midline shift. Vascular: Calcified atherosclerosis identified in the intracranial carotid arteries. Skull: Normal. Negative for fracture or focal lesion. Sinuses/Orbits: Study limited due to motion. Within this limitation, no acute abnormalities seen in the paranasal sinuses, mastoid air cells, or middle ears. Other: None. IMPRESSION: 1. Moderate severe white matter changes. No acute intracranial abnormality. Electronically Signed   By: Gerome Sam III M.D   On: 04/25/2017 13:52       Discharge Exam: Vitals:   04/27/17 2056 04/28/17 0522  BP: (!) 144/76 (!) 153/65  Pulse: 81 65  Resp: 19 18  Temp: 97.9 F (36.6 C) 98.2 F (36.8 C)   Vitals:   04/27/17 1501 04/27/17 1749 04/27/17 2056 04/28/17 0522  BP: (!) 162/92 (!) 161/85 (!) 144/76 (!) 153/65  Pulse:   81 65  Resp:   19 18  Temp:   97.9 F (36.6 C) 98.2 F (36.8 C)  TempSrc:   Oral Oral  SpO2:   93% 93%  Weight:      Height:        General: Pt is alert, awake, confused,  not in acute distress Cardiovascular: RRR, S1/S2 +, no rubs, no gallops Respiratory: CTA bilaterally, no wheezing, no rhonchi Abdominal: Soft, NT, ND, bowel sounds + Extremities: no edema, no cyanosis    The results of significant diagnostics from this  hospitalization (including imaging, microbiology, ancillary and laboratory) are listed  below for reference.     Microbiology: Recent Results (from the past 240 hour(s))  Urine culture     Status: Abnormal   Collection Time: 04/25/17 12:59 PM  Result Value Ref Range Status   Specimen Description URINE, CLEAN CATCH  Final   Special Requests NONE  Final   Culture >=100,000 COLONIES/mL ESCHERICHIA COLI (A)  Final   Report Status 04/27/2017 FINAL  Final   Organism ID, Bacteria ESCHERICHIA COLI (A)  Final      Susceptibility   Escherichia coli - MIC*    AMPICILLIN >=32 RESISTANT Resistant     CEFAZOLIN <=4 SENSITIVE Sensitive     CEFTRIAXONE <=1 SENSITIVE Sensitive     CIPROFLOXACIN >=4 RESISTANT Resistant     GENTAMICIN <=1 SENSITIVE Sensitive     IMIPENEM <=0.25 SENSITIVE Sensitive     NITROFURANTOIN <=16 SENSITIVE Sensitive     TRIMETH/SULFA <=20 SENSITIVE Sensitive     AMPICILLIN/SULBACTAM >=32 RESISTANT Resistant     PIP/TAZO 8 SENSITIVE Sensitive     Extended ESBL NEGATIVE Sensitive     * >=100,000 COLONIES/mL ESCHERICHIA COLI  MRSA PCR Screening     Status: None   Collection Time: 04/26/17  3:29 AM  Result Value Ref Range Status   MRSA by PCR NEGATIVE NEGATIVE Final    Comment:        The GeneXpert MRSA Assay (FDA approved for NASAL specimens only), is one component of a comprehensive MRSA colonization surveillance program. It is not intended to diagnose MRSA infection nor to guide or monitor treatment for MRSA infections.      Labs: BNP (last 3 results) No results for input(s): BNP in the last 8760 hours. Basic Metabolic Panel:  Recent Labs Lab 04/25/17 1302 04/25/17 1314 04/26/17 0620  NA 139 140 143  K 3.3* 3.3* 3.5  CL 103 101 109  CO2 29  --  28  GLUCOSE 156* 149* 99  BUN 21* 23* 19  CREATININE 1.61* 1.60* 1.38*  CALCIUM 9.4  --  8.9   Liver Function Tests:  Recent Labs Lab 04/25/17 1302  AST 25  ALT 17  ALKPHOS 40  BILITOT 0.5  PROT 6.9  ALBUMIN 3.5   No results for input(s): LIPASE, AMYLASE in the last 168  hours.  Recent Labs Lab 04/25/17 1302  AMMONIA 24   CBC:  Recent Labs Lab 04/25/17 1302 04/25/17 1314 04/26/17 0620  WBC 9.2  --  8.0  NEUTROABS 5.5  --   --   HGB 13.0 12.9 12.3  HCT 39.5 38.0 37.8  MCV 84.8  --  84.8  PLT 312  --  285   Cardiac Enzymes: No results for input(s): CKTOTAL, CKMB, CKMBINDEX, TROPONINI in the last 168 hours. BNP: Invalid input(s): POCBNP CBG:  Recent Labs Lab 04/25/17 1403  GLUCAP 117*   D-Dimer No results for input(s): DDIMER in the last 72 hours. Hgb A1c No results for input(s): HGBA1C in the last 72 hours. Lipid Profile No results for input(s): CHOL, HDL, LDLCALC, TRIG, CHOLHDL, LDLDIRECT in the last 72 hours. Thyroid function studies  Recent Labs  04/25/17 1301  TSH 3.011   Anemia work up No results for input(s): VITAMINB12, FOLATE, FERRITIN, TIBC, IRON, RETICCTPCT in the last 72 hours. Urinalysis    Component Value Date/Time   COLORURINE YELLOW 04/25/2017 1259   APPEARANCEUR HAZY (A) 04/25/2017 1259   LABSPEC 1.008 04/25/2017 1259   PHURINE 5.0 04/25/2017 1259  GLUCOSEU NEGATIVE 04/25/2017 1259   HGBUR NEGATIVE 04/25/2017 1259   BILIRUBINUR NEGATIVE 04/25/2017 1259   KETONESUR NEGATIVE 04/25/2017 1259   PROTEINUR NEGATIVE 04/25/2017 1259   NITRITE NEGATIVE 04/25/2017 1259   LEUKOCYTESUR MODERATE (A) 04/25/2017 1259   Sepsis Labs Invalid input(s): PROCALCITONIN,  WBC,  LACTICIDVEN Microbiology Recent Results (from the past 240 hour(s))  Urine culture     Status: Abnormal   Collection Time: 04/25/17 12:59 PM  Result Value Ref Range Status   Specimen Description URINE, CLEAN CATCH  Final   Special Requests NONE  Final   Culture >=100,000 COLONIES/mL ESCHERICHIA COLI (A)  Final   Report Status 04/27/2017 FINAL  Final   Organism ID, Bacteria ESCHERICHIA COLI (A)  Final      Susceptibility   Escherichia coli - MIC*    AMPICILLIN >=32 RESISTANT Resistant     CEFAZOLIN <=4 SENSITIVE Sensitive     CEFTRIAXONE <=1  SENSITIVE Sensitive     CIPROFLOXACIN >=4 RESISTANT Resistant     GENTAMICIN <=1 SENSITIVE Sensitive     IMIPENEM <=0.25 SENSITIVE Sensitive     NITROFURANTOIN <=16 SENSITIVE Sensitive     TRIMETH/SULFA <=20 SENSITIVE Sensitive     AMPICILLIN/SULBACTAM >=32 RESISTANT Resistant     PIP/TAZO 8 SENSITIVE Sensitive     Extended ESBL NEGATIVE Sensitive     * >=100,000 COLONIES/mL ESCHERICHIA COLI  MRSA PCR Screening     Status: None   Collection Time: 04/26/17  3:29 AM  Result Value Ref Range Status   MRSA by PCR NEGATIVE NEGATIVE Final    Comment:        The GeneXpert MRSA Assay (FDA approved for NASAL specimens only), is one component of a comprehensive MRSA colonization surveillance program. It is not intended to diagnose MRSA infection nor to guide or monitor treatment for MRSA infections.      Time coordinating discharge: Over 30 minutes  SIGNED:   Calvert Cantor, MD  [facility] 04/28/2017, 7:34 AM Pager   If 7PM-7AM, please contact night-coverage [email or web address] Password [email or web address]

## 2017-04-27 NOTE — Progress Notes (Signed)
[facility]   Son is not agreement with discharge today as he is afraid that she will have a reaction to Keflex. She has been receiving Rocephin through the hospital stay.  He thinks her previous reaction to Keflex was, "HTN, anxiety, confusion" as a result   but he cannot recall it exactly. Upon reviewing her allergy list, she has numerous reactions to medications. I have d/c'd her discharge order.   Calvert CantorSaima Breta Demedeiros, MD

## 2017-04-27 NOTE — Progress Notes (Signed)
CSW informed admission coordinator[facility] at [facility], patient will not d/c today per physician.   Vivi BarrackNicole Khalis Hittle, Theresia MajorsLCSWA, MSW Clinical Social Worker 5E and Psychiatric Service Line [phone] 04/27/2017  2:59 PM

## 2017-04-27 NOTE — Care Management Note (Signed)
Case Management Note  Patient Details  Name: Sharia ReeveVirginia Sinclair MRN: [id number] Date of Birth: 02/01/1931  Subjective/Objective:                  83 y.o. female,  With H/O dementia diagnosed 2 yrs ago, CK D stage III, HTN,With thyroidism, who lives at assisted living facility, tends to develop confusion related to UTI brought in by son with confusion. According to the son 2 days ago she complained of some low back pain and was brought to the ER and was discharged after a normal back x-ray was obtained, she continued to get gradually more confused and was brought again today where workup showed mild UTI. Patient has no subjective complaints and I was called to admit the patient. She is currently back pain-free.   Action/Plan: Will follow for case management needs  Expected Discharge Date:   (unknown)    [id number]           Expected Discharge Plan:  Assisted Living / Rest Home  In-House Referral:  Clinical Social Work  Discharge planning Services  CM Consult  Post Acute Care Choice:    Choice offered to:     DME Arranged:    DME Agency:     HH Arranged:    HH Agency:     Status of Service:  In process, will continue to follow  If discussed at Long Length of Stay Meetings, dates discussed:    Additional Comments:  Golda AcreDavis, Wrenn Willcox Lynn, RN 04/27/2017, 9:17 AM

## 2017-04-27 NOTE — Clinical Social Work Placement (Addendum)
   CLINICAL SOCIAL WORK PLACEMENT  NOTE  Date:  04/27/2017  Patient Details  Name: Kathryn Simmons MRN: [id number] Date of Birth: 04/23/1931  Clinical Social Work is seeking post-discharge placement for this patient at the Skilled  Nursing Facility level of care (*CSW will initial, date and re-position this form in  chart as items are completed):  No   Patient/family provided with [facility] Clinical Social Work Department's list of facilities offering this level of care within the geographic area requested by the patient (or if unable, by the patient's family).  No   Patient/family informed of their freedom to choose among providers that offer the needed level of care, that participate in Medicare, Medicaid or managed care program needed by the patient, have an available bed and are willing to accept the patient.  No   Patient/family informed of [facility]'s ownership interest in [facility] and [facility], as well as of the fact that they are under no obligation to receive care at these facilities.  PASRR submitted to EDS on 04/27/17     PASRR number received on 04/27/17     Existing PASRR number confirmed on       FL2 transmitted to all facilities in geographic area requested by pt/family on       FL2 transmitted to all facilities within larger geographic area on       Patient informed that his/her managed care company has contracts with or will negotiate with certain facilities, including the following:  [name]     No   Patient/family informed of bed offers received.  [facility]     Physician recommends and patient chooses bed at      [facility].  Patient to be transferred to facility by [facility]     Patient family notified on 04/28/17 of transfer.  Name of family member notified:  Son-Tom     PHYSICIAN Please sign FL2, Please sign DNR     Additional Comment:  [name] before returning to Independent Living.   _______________________________________________ Clearance CootsNicole A Ariella Voit, LCSW 04/27/2017, 1:08 PM

## 2017-04-28 MED ORDER — AMLODIPINE BESYLATE 10 MG PO TABS
10.0000 mg | ORAL_TABLET | Freq: Every day | ORAL | Status: AC
Start: 2017-04-28 — End: ?

## 2017-04-28 MED ORDER — AMLODIPINE BESYLATE 10 MG PO TABS
10.0000 mg | ORAL_TABLET | Freq: Every day | ORAL | Status: DC
Start: 2017-04-28 — End: 2017-04-28
  Administered 2017-04-28: 10 mg via ORAL
  Filled 2017-04-28: qty 1

## 2017-04-28 NOTE — Progress Notes (Signed)
Called report to Careers adviseruvica RN at [facility].

## 2017-04-28 NOTE — Progress Notes (Signed)
[facility]. Patient son agreeable with discharge. [facility] called for transport. Nurse given number for report. No other needs identified.   Vivi BarrackNicole Meshia Rau, Theresia MajorsLCSWA, MSW Clinical Social Worker 5E and Psychiatric Service Line [phone] 04/28/2017  2:04 PM

## 2017-04-28 NOTE — Progress Notes (Signed)
Physical Therapy Treatment Patient Details Name: Sharia ReeveVirginia Hawker MRN: [id number] DOB: 11/12/1931 Today's Date: 04/28/2017    History of Present Illness Sharia ReeveVirginia Cajuste  is a 83 y.o. female,  With H/O dementia diagnosed 2 yrs ago, CK D stage III, HTN,With thyroidism, brought  in by son with confusion 04/25/17. Had been in Ed for back pain recently. found with mild UTI    PT Comments    The patient ambulated  With and without RW, noted 4 episodes of slight tripping. [facility]  She  Does not appear to be at her baseline, recommend close supervision and assistance with higher level ADL's due to fall risk. Continue PT.  Follow Up Recommendations  Home health PT;SNF;Supervision/Assistance - 24 hour     Equipment Recommendations  None recommended by PT    Recommendations for Other Services       Precautions / Restrictions Precautions Precautions: Fall Restrictions Weight Bearing Restrictions: No    Mobility  Bed Mobility Overal bed mobility: Independent                Transfers Overall transfer level: Needs assistance Equipment used: None Transfers: Sit to/from Stand Sit to Stand: Supervision         General transfer comment: steady to stand up  Ambulation/Gait   Ambulation Distance (Feet): 400 Feet Assistive device: Rolling walker (2 wheeled);None       General Gait Details: ambulated x 80' without RW, noted 3  episodes of mild tripping (wearing sneakers), no overt balance loss. ambulated remainder with RW with 1 tripping episode.   Stairs            Wheelchair Mobility    Modified Rankin (Stroke Patients Only)       Balance Overall balance assessment: History of Falls   Sitting balance-Leahy Scale: Normal Sitting balance - Comments: donned shoes sitting   Standing balance support: No upper extremity supported Standing balance-Leahy Scale: Fair Standing balance comment: assist to stand and don  pull over robe, no balance loss noted                            Cognition Arousal/Alertness: Awake/alert   Overall Cognitive Status: No family/caregiver present to determine baseline cognitive functioning Area of Impairment: Orientation                 Orientation Level: Place;Time;Situation             General Comments: history of dementia, oriented to self      Exercises      General Comments        Pertinent Vitals/Pain Faces Pain Scale: No hurt    Home Living                      Prior Function            PT Goals (current goals can now be found in the care plan section) Progress towards PT goals: Progressing toward goals    Frequency    Min 3X/week      PT Plan      Co-evaluation              AM-PAC PT "6 Clicks" Daily Activity  Outcome Measure  Difficulty turning over in bed (including adjusting bedclothes, sheets and blankets)?: None Difficulty moving from lying on back to sitting on the side of the bed? : None Difficulty sitting  down on and standing up from a chair with arms (e.g., wheelchair, bedside commode, etc,.)?: None Help needed moving to and from a bed to chair (including a wheelchair)?: A Little Help needed walking in hospital room?: A Little Help needed climbing 3-5 steps with a railing? : A Little 6 Click Score: 21    End of Session Equipment Utilized During Treatment: Gait belt Activity Tolerance: Patient tolerated treatment well Patient left: in chair;with call bell/phone within reach;with chair alarm set Nurse Communication: Mobility status       Time: 0950-1010 PT Time Calculation (min) (ACUTE ONLY): 20 min  Charges:  $Gait Training: 8-22 mins                    G CodesBlanchard Kelch PT [phone]    Rada Hay 04/28/2017, 10:15 AM

## 2017-04-28 NOTE — Progress Notes (Signed)
[facility]  I have examined Mrs Kathryn Simmons and reviewed her chart. She will be discharged to SNF today. Please see my updated d/c summary from 5/30.    Calvert CantorSaima Tyshika Baldridge, MD

## 2017-04-29 DIAGNOSIS — R5381 Other malaise: Secondary | ICD-10-CM | POA: Diagnosis not present

## 2017-04-29 DIAGNOSIS — I10 Essential (primary) hypertension: Secondary | ICD-10-CM | POA: Diagnosis not present

## 2017-04-29 DIAGNOSIS — R278 Other lack of coordination: Secondary | ICD-10-CM | POA: Diagnosis not present

## 2017-04-29 DIAGNOSIS — R41841 Cognitive communication deficit: Secondary | ICD-10-CM | POA: Diagnosis not present

## 2017-04-29 DIAGNOSIS — R2681 Unsteadiness on feet: Secondary | ICD-10-CM | POA: Diagnosis not present

## 2017-04-29 DIAGNOSIS — F0391 Unspecified dementia with behavioral disturbance: Secondary | ICD-10-CM | POA: Diagnosis not present

## 2017-04-29 DIAGNOSIS — G301 Alzheimer's disease with late onset: Secondary | ICD-10-CM | POA: Diagnosis not present

## 2017-04-29 DIAGNOSIS — G92 Toxic encephalopathy: Secondary | ICD-10-CM | POA: Diagnosis not present

## 2017-04-29 DIAGNOSIS — N390 Urinary tract infection, site not specified: Secondary | ICD-10-CM | POA: Diagnosis not present

## 2017-04-29 DIAGNOSIS — E039 Hypothyroidism, unspecified: Secondary | ICD-10-CM | POA: Diagnosis not present

## 2017-04-29 DIAGNOSIS — M6281 Muscle weakness (generalized): Secondary | ICD-10-CM | POA: Diagnosis not present

## 2017-04-30 DIAGNOSIS — M6281 Muscle weakness (generalized): Secondary | ICD-10-CM | POA: Diagnosis not present

## 2017-04-30 DIAGNOSIS — R2681 Unsteadiness on feet: Secondary | ICD-10-CM | POA: Diagnosis not present

## 2017-04-30 DIAGNOSIS — F0391 Unspecified dementia with behavioral disturbance: Secondary | ICD-10-CM | POA: Diagnosis not present

## 2017-04-30 DIAGNOSIS — N390 Urinary tract infection, site not specified: Secondary | ICD-10-CM | POA: Diagnosis not present

## 2017-04-30 DIAGNOSIS — R41841 Cognitive communication deficit: Secondary | ICD-10-CM | POA: Diagnosis not present

## 2017-04-30 DIAGNOSIS — G92 Toxic encephalopathy: Secondary | ICD-10-CM | POA: Diagnosis not present

## 2017-05-02 DIAGNOSIS — R2681 Unsteadiness on feet: Secondary | ICD-10-CM | POA: Diagnosis not present

## 2017-05-02 DIAGNOSIS — N390 Urinary tract infection, site not specified: Secondary | ICD-10-CM | POA: Diagnosis not present

## 2017-05-02 DIAGNOSIS — G92 Toxic encephalopathy: Secondary | ICD-10-CM | POA: Diagnosis not present

## 2017-05-02 DIAGNOSIS — M6281 Muscle weakness (generalized): Secondary | ICD-10-CM | POA: Diagnosis not present

## 2017-05-02 DIAGNOSIS — R41841 Cognitive communication deficit: Secondary | ICD-10-CM | POA: Diagnosis not present

## 2017-05-02 DIAGNOSIS — F0391 Unspecified dementia with behavioral disturbance: Secondary | ICD-10-CM | POA: Diagnosis not present

## 2017-05-03 DIAGNOSIS — R2681 Unsteadiness on feet: Secondary | ICD-10-CM | POA: Diagnosis not present

## 2017-05-03 DIAGNOSIS — N390 Urinary tract infection, site not specified: Secondary | ICD-10-CM | POA: Diagnosis not present

## 2017-05-03 DIAGNOSIS — R41841 Cognitive communication deficit: Secondary | ICD-10-CM | POA: Diagnosis not present

## 2017-05-03 DIAGNOSIS — M6281 Muscle weakness (generalized): Secondary | ICD-10-CM | POA: Diagnosis not present

## 2017-05-03 DIAGNOSIS — G92 Toxic encephalopathy: Secondary | ICD-10-CM | POA: Diagnosis not present

## 2017-05-03 DIAGNOSIS — F0391 Unspecified dementia with behavioral disturbance: Secondary | ICD-10-CM | POA: Diagnosis not present

## 2017-05-04 DIAGNOSIS — M6281 Muscle weakness (generalized): Secondary | ICD-10-CM | POA: Diagnosis not present

## 2017-05-04 DIAGNOSIS — F0391 Unspecified dementia with behavioral disturbance: Secondary | ICD-10-CM | POA: Diagnosis not present

## 2017-05-04 DIAGNOSIS — R2681 Unsteadiness on feet: Secondary | ICD-10-CM | POA: Diagnosis not present

## 2017-05-04 DIAGNOSIS — N390 Urinary tract infection, site not specified: Secondary | ICD-10-CM | POA: Diagnosis not present

## 2017-05-04 DIAGNOSIS — R41841 Cognitive communication deficit: Secondary | ICD-10-CM | POA: Diagnosis not present

## 2017-05-04 DIAGNOSIS — G92 Toxic encephalopathy: Secondary | ICD-10-CM | POA: Diagnosis not present

## 2017-05-05 DIAGNOSIS — R2681 Unsteadiness on feet: Secondary | ICD-10-CM | POA: Diagnosis not present

## 2017-05-05 DIAGNOSIS — M6281 Muscle weakness (generalized): Secondary | ICD-10-CM | POA: Diagnosis not present

## 2017-05-05 DIAGNOSIS — F0391 Unspecified dementia with behavioral disturbance: Secondary | ICD-10-CM | POA: Diagnosis not present

## 2017-05-05 DIAGNOSIS — G92 Toxic encephalopathy: Secondary | ICD-10-CM | POA: Diagnosis not present

## 2017-05-05 DIAGNOSIS — R41841 Cognitive communication deficit: Secondary | ICD-10-CM | POA: Diagnosis not present

## 2017-05-05 DIAGNOSIS — N390 Urinary tract infection, site not specified: Secondary | ICD-10-CM | POA: Diagnosis not present

## 2017-05-06 DIAGNOSIS — F0391 Unspecified dementia with behavioral disturbance: Secondary | ICD-10-CM | POA: Diagnosis not present

## 2017-05-06 DIAGNOSIS — M6281 Muscle weakness (generalized): Secondary | ICD-10-CM | POA: Diagnosis not present

## 2017-05-06 DIAGNOSIS — R2681 Unsteadiness on feet: Secondary | ICD-10-CM | POA: Diagnosis not present

## 2017-05-06 DIAGNOSIS — N390 Urinary tract infection, site not specified: Secondary | ICD-10-CM | POA: Diagnosis not present

## 2017-05-06 DIAGNOSIS — R41841 Cognitive communication deficit: Secondary | ICD-10-CM | POA: Diagnosis not present

## 2017-05-06 DIAGNOSIS — G92 Toxic encephalopathy: Secondary | ICD-10-CM | POA: Diagnosis not present

## 2017-05-07 DIAGNOSIS — R41841 Cognitive communication deficit: Secondary | ICD-10-CM | POA: Diagnosis not present

## 2017-05-07 DIAGNOSIS — F0391 Unspecified dementia with behavioral disturbance: Secondary | ICD-10-CM | POA: Diagnosis not present

## 2017-05-07 DIAGNOSIS — N390 Urinary tract infection, site not specified: Secondary | ICD-10-CM | POA: Diagnosis not present

## 2017-05-07 DIAGNOSIS — M6281 Muscle weakness (generalized): Secondary | ICD-10-CM | POA: Diagnosis not present

## 2017-05-07 DIAGNOSIS — G92 Toxic encephalopathy: Secondary | ICD-10-CM | POA: Diagnosis not present

## 2017-05-07 DIAGNOSIS — R2681 Unsteadiness on feet: Secondary | ICD-10-CM | POA: Diagnosis not present

## 2017-05-09 DIAGNOSIS — G92 Toxic encephalopathy: Secondary | ICD-10-CM | POA: Diagnosis not present

## 2017-05-09 DIAGNOSIS — R41841 Cognitive communication deficit: Secondary | ICD-10-CM | POA: Diagnosis not present

## 2017-05-09 DIAGNOSIS — F0391 Unspecified dementia with behavioral disturbance: Secondary | ICD-10-CM | POA: Diagnosis not present

## 2017-05-09 DIAGNOSIS — N390 Urinary tract infection, site not specified: Secondary | ICD-10-CM | POA: Diagnosis not present

## 2017-05-09 DIAGNOSIS — M6281 Muscle weakness (generalized): Secondary | ICD-10-CM | POA: Diagnosis not present

## 2017-05-09 DIAGNOSIS — R2681 Unsteadiness on feet: Secondary | ICD-10-CM | POA: Diagnosis not present

## 2017-05-10 DIAGNOSIS — R2681 Unsteadiness on feet: Secondary | ICD-10-CM | POA: Diagnosis not present

## 2017-05-10 DIAGNOSIS — R41841 Cognitive communication deficit: Secondary | ICD-10-CM | POA: Diagnosis not present

## 2017-05-10 DIAGNOSIS — N390 Urinary tract infection, site not specified: Secondary | ICD-10-CM | POA: Diagnosis not present

## 2017-05-10 DIAGNOSIS — F0391 Unspecified dementia with behavioral disturbance: Secondary | ICD-10-CM | POA: Diagnosis not present

## 2017-05-10 DIAGNOSIS — G92 Toxic encephalopathy: Secondary | ICD-10-CM | POA: Diagnosis not present

## 2017-05-10 DIAGNOSIS — M6281 Muscle weakness (generalized): Secondary | ICD-10-CM | POA: Diagnosis not present

## 2017-05-11 DIAGNOSIS — R2681 Unsteadiness on feet: Secondary | ICD-10-CM | POA: Diagnosis not present

## 2017-05-11 DIAGNOSIS — F0391 Unspecified dementia with behavioral disturbance: Secondary | ICD-10-CM | POA: Diagnosis not present

## 2017-05-11 DIAGNOSIS — M6281 Muscle weakness (generalized): Secondary | ICD-10-CM | POA: Diagnosis not present

## 2017-05-11 DIAGNOSIS — G92 Toxic encephalopathy: Secondary | ICD-10-CM | POA: Diagnosis not present

## 2017-05-11 DIAGNOSIS — N390 Urinary tract infection, site not specified: Secondary | ICD-10-CM | POA: Diagnosis not present

## 2017-05-11 DIAGNOSIS — R41841 Cognitive communication deficit: Secondary | ICD-10-CM | POA: Diagnosis not present

## 2017-05-12 DIAGNOSIS — N390 Urinary tract infection, site not specified: Secondary | ICD-10-CM | POA: Diagnosis not present

## 2017-05-12 DIAGNOSIS — G92 Toxic encephalopathy: Secondary | ICD-10-CM | POA: Diagnosis not present

## 2017-05-12 DIAGNOSIS — R41841 Cognitive communication deficit: Secondary | ICD-10-CM | POA: Diagnosis not present

## 2017-05-12 DIAGNOSIS — F0391 Unspecified dementia with behavioral disturbance: Secondary | ICD-10-CM | POA: Diagnosis not present

## 2017-05-12 DIAGNOSIS — M6281 Muscle weakness (generalized): Secondary | ICD-10-CM | POA: Diagnosis not present

## 2017-05-12 DIAGNOSIS — R2681 Unsteadiness on feet: Secondary | ICD-10-CM | POA: Diagnosis not present

## 2017-05-13 DIAGNOSIS — M6281 Muscle weakness (generalized): Secondary | ICD-10-CM | POA: Diagnosis not present

## 2017-05-13 DIAGNOSIS — I10 Essential (primary) hypertension: Secondary | ICD-10-CM | POA: Diagnosis not present

## 2017-05-13 DIAGNOSIS — D649 Anemia, unspecified: Secondary | ICD-10-CM | POA: Diagnosis not present

## 2017-05-13 DIAGNOSIS — N390 Urinary tract infection, site not specified: Secondary | ICD-10-CM | POA: Diagnosis not present

## 2017-05-13 DIAGNOSIS — R41841 Cognitive communication deficit: Secondary | ICD-10-CM | POA: Diagnosis not present

## 2017-05-13 DIAGNOSIS — F0391 Unspecified dementia with behavioral disturbance: Secondary | ICD-10-CM | POA: Diagnosis not present

## 2017-05-13 DIAGNOSIS — G92 Toxic encephalopathy: Secondary | ICD-10-CM | POA: Diagnosis not present

## 2017-05-13 DIAGNOSIS — R2681 Unsteadiness on feet: Secondary | ICD-10-CM | POA: Diagnosis not present

## 2017-05-14 DIAGNOSIS — R41841 Cognitive communication deficit: Secondary | ICD-10-CM | POA: Diagnosis not present

## 2017-05-14 DIAGNOSIS — G92 Toxic encephalopathy: Secondary | ICD-10-CM | POA: Diagnosis not present

## 2017-05-14 DIAGNOSIS — N390 Urinary tract infection, site not specified: Secondary | ICD-10-CM | POA: Diagnosis not present

## 2017-05-14 DIAGNOSIS — R2681 Unsteadiness on feet: Secondary | ICD-10-CM | POA: Diagnosis not present

## 2017-05-14 DIAGNOSIS — M6281 Muscle weakness (generalized): Secondary | ICD-10-CM | POA: Diagnosis not present

## 2017-05-14 DIAGNOSIS — F0391 Unspecified dementia with behavioral disturbance: Secondary | ICD-10-CM | POA: Diagnosis not present

## 2017-05-15 DIAGNOSIS — N390 Urinary tract infection, site not specified: Secondary | ICD-10-CM | POA: Diagnosis not present

## 2017-05-15 DIAGNOSIS — G92 Toxic encephalopathy: Secondary | ICD-10-CM | POA: Diagnosis not present

## 2017-05-15 DIAGNOSIS — R2681 Unsteadiness on feet: Secondary | ICD-10-CM | POA: Diagnosis not present

## 2017-05-15 DIAGNOSIS — R41841 Cognitive communication deficit: Secondary | ICD-10-CM | POA: Diagnosis not present

## 2017-05-15 DIAGNOSIS — F0391 Unspecified dementia with behavioral disturbance: Secondary | ICD-10-CM | POA: Diagnosis not present

## 2017-05-15 DIAGNOSIS — M6281 Muscle weakness (generalized): Secondary | ICD-10-CM | POA: Diagnosis not present

## 2017-05-16 DIAGNOSIS — M6281 Muscle weakness (generalized): Secondary | ICD-10-CM | POA: Diagnosis not present

## 2017-05-16 DIAGNOSIS — F0391 Unspecified dementia with behavioral disturbance: Secondary | ICD-10-CM | POA: Diagnosis not present

## 2017-05-16 DIAGNOSIS — N390 Urinary tract infection, site not specified: Secondary | ICD-10-CM | POA: Diagnosis not present

## 2017-05-16 DIAGNOSIS — R41841 Cognitive communication deficit: Secondary | ICD-10-CM | POA: Diagnosis not present

## 2017-05-16 DIAGNOSIS — R2681 Unsteadiness on feet: Secondary | ICD-10-CM | POA: Diagnosis not present

## 2017-05-16 DIAGNOSIS — G92 Toxic encephalopathy: Secondary | ICD-10-CM | POA: Diagnosis not present

## 2017-05-19 DIAGNOSIS — I10 Essential (primary) hypertension: Secondary | ICD-10-CM | POA: Diagnosis not present

## 2017-05-19 DIAGNOSIS — R609 Edema, unspecified: Secondary | ICD-10-CM | POA: Diagnosis not present

## 2017-05-19 DIAGNOSIS — E039 Hypothyroidism, unspecified: Secondary | ICD-10-CM | POA: Diagnosis not present

## 2017-05-19 DIAGNOSIS — N390 Urinary tract infection, site not specified: Secondary | ICD-10-CM | POA: Diagnosis not present

## 2017-05-23 DIAGNOSIS — E038 Other specified hypothyroidism: Secondary | ICD-10-CM | POA: Diagnosis not present

## 2017-05-23 DIAGNOSIS — Z79899 Other long term (current) drug therapy: Secondary | ICD-10-CM | POA: Diagnosis not present

## 2017-05-23 DIAGNOSIS — E119 Type 2 diabetes mellitus without complications: Secondary | ICD-10-CM | POA: Diagnosis not present

## 2017-05-23 DIAGNOSIS — D518 Other vitamin B12 deficiency anemias: Secondary | ICD-10-CM | POA: Diagnosis not present

## 2017-05-23 DIAGNOSIS — E782 Mixed hyperlipidemia: Secondary | ICD-10-CM | POA: Diagnosis not present

## 2017-05-23 DIAGNOSIS — L03115 Cellulitis of right lower limb: Secondary | ICD-10-CM | POA: Diagnosis not present

## 2017-05-24 DIAGNOSIS — Z87440 Personal history of urinary (tract) infections: Secondary | ICD-10-CM | POA: Diagnosis not present

## 2017-05-24 DIAGNOSIS — I10 Essential (primary) hypertension: Secondary | ICD-10-CM | POA: Diagnosis not present

## 2017-05-24 DIAGNOSIS — N390 Urinary tract infection, site not specified: Secondary | ICD-10-CM | POA: Diagnosis not present

## 2017-06-06 DIAGNOSIS — Z79899 Other long term (current) drug therapy: Secondary | ICD-10-CM | POA: Diagnosis not present

## 2017-06-06 DIAGNOSIS — E559 Vitamin D deficiency, unspecified: Secondary | ICD-10-CM | POA: Diagnosis not present

## 2017-06-06 DIAGNOSIS — E782 Mixed hyperlipidemia: Secondary | ICD-10-CM | POA: Diagnosis not present

## 2017-06-06 DIAGNOSIS — D518 Other vitamin B12 deficiency anemias: Secondary | ICD-10-CM | POA: Diagnosis not present

## 2017-06-06 DIAGNOSIS — E038 Other specified hypothyroidism: Secondary | ICD-10-CM | POA: Diagnosis not present

## 2017-06-06 DIAGNOSIS — E119 Type 2 diabetes mellitus without complications: Secondary | ICD-10-CM | POA: Diagnosis not present

## 2017-06-06 DIAGNOSIS — R21 Rash and other nonspecific skin eruption: Secondary | ICD-10-CM | POA: Diagnosis not present

## 2017-06-06 DIAGNOSIS — R609 Edema, unspecified: Secondary | ICD-10-CM | POA: Diagnosis not present

## 2017-06-08 DIAGNOSIS — N390 Urinary tract infection, site not specified: Secondary | ICD-10-CM | POA: Diagnosis not present

## 2017-06-08 DIAGNOSIS — N3289 Other specified disorders of bladder: Secondary | ICD-10-CM | POA: Diagnosis not present

## 2017-06-09 DIAGNOSIS — R011 Cardiac murmur, unspecified: Secondary | ICD-10-CM | POA: Diagnosis not present

## 2017-06-10 DIAGNOSIS — Z658 Other specified problems related to psychosocial circumstances: Secondary | ICD-10-CM | POA: Diagnosis not present

## 2017-06-10 DIAGNOSIS — F0391 Unspecified dementia with behavioral disturbance: Secondary | ICD-10-CM | POA: Diagnosis not present

## 2017-06-16 DIAGNOSIS — I10 Essential (primary) hypertension: Secondary | ICD-10-CM | POA: Diagnosis not present

## 2017-06-16 DIAGNOSIS — R21 Rash and other nonspecific skin eruption: Secondary | ICD-10-CM | POA: Diagnosis not present

## 2017-06-16 DIAGNOSIS — F0391 Unspecified dementia with behavioral disturbance: Secondary | ICD-10-CM | POA: Diagnosis not present

## 2017-06-16 DIAGNOSIS — E039 Hypothyroidism, unspecified: Secondary | ICD-10-CM | POA: Diagnosis not present

## 2017-06-22 DIAGNOSIS — E119 Type 2 diabetes mellitus without complications: Secondary | ICD-10-CM | POA: Diagnosis not present

## 2017-06-22 DIAGNOSIS — E782 Mixed hyperlipidemia: Secondary | ICD-10-CM | POA: Diagnosis not present

## 2017-06-22 DIAGNOSIS — D518 Other vitamin B12 deficiency anemias: Secondary | ICD-10-CM | POA: Diagnosis not present

## 2017-06-22 DIAGNOSIS — Z79899 Other long term (current) drug therapy: Secondary | ICD-10-CM | POA: Diagnosis not present

## 2017-08-11 DIAGNOSIS — E039 Hypothyroidism, unspecified: Secondary | ICD-10-CM | POA: Diagnosis not present

## 2017-08-11 DIAGNOSIS — I10 Essential (primary) hypertension: Secondary | ICD-10-CM | POA: Diagnosis not present

## 2017-08-11 DIAGNOSIS — F0391 Unspecified dementia with behavioral disturbance: Secondary | ICD-10-CM | POA: Diagnosis not present

## 2017-08-29 DIAGNOSIS — N390 Urinary tract infection, site not specified: Secondary | ICD-10-CM | POA: Diagnosis not present

## 2017-08-29 DIAGNOSIS — I10 Essential (primary) hypertension: Secondary | ICD-10-CM | POA: Diagnosis not present

## 2017-09-05 DIAGNOSIS — D518 Other vitamin B12 deficiency anemias: Secondary | ICD-10-CM | POA: Diagnosis not present

## 2017-09-05 DIAGNOSIS — E559 Vitamin D deficiency, unspecified: Secondary | ICD-10-CM | POA: Diagnosis not present

## 2017-09-05 DIAGNOSIS — E7849 Other hyperlipidemia: Secondary | ICD-10-CM | POA: Diagnosis not present

## 2017-09-05 DIAGNOSIS — Z79899 Other long term (current) drug therapy: Secondary | ICD-10-CM | POA: Diagnosis not present

## 2017-09-05 DIAGNOSIS — E038 Other specified hypothyroidism: Secondary | ICD-10-CM | POA: Diagnosis not present

## 2017-09-05 DIAGNOSIS — E119 Type 2 diabetes mellitus without complications: Secondary | ICD-10-CM | POA: Diagnosis not present

## 2017-09-08 DIAGNOSIS — Z23 Encounter for immunization: Secondary | ICD-10-CM | POA: Diagnosis not present

## 2017-09-15 DIAGNOSIS — N189 Chronic kidney disease, unspecified: Secondary | ICD-10-CM | POA: Diagnosis not present

## 2017-09-15 DIAGNOSIS — F0391 Unspecified dementia with behavioral disturbance: Secondary | ICD-10-CM | POA: Diagnosis not present

## 2017-09-15 DIAGNOSIS — I10 Essential (primary) hypertension: Secondary | ICD-10-CM | POA: Diagnosis not present

## 2017-09-15 DIAGNOSIS — E039 Hypothyroidism, unspecified: Secondary | ICD-10-CM | POA: Diagnosis not present

## 2017-10-13 DIAGNOSIS — E039 Hypothyroidism, unspecified: Secondary | ICD-10-CM | POA: Diagnosis not present

## 2017-10-13 DIAGNOSIS — E785 Hyperlipidemia, unspecified: Secondary | ICD-10-CM | POA: Diagnosis not present

## 2017-10-13 DIAGNOSIS — I10 Essential (primary) hypertension: Secondary | ICD-10-CM | POA: Diagnosis not present

## 2017-10-13 DIAGNOSIS — N189 Chronic kidney disease, unspecified: Secondary | ICD-10-CM | POA: Diagnosis not present

## 2017-11-09 DIAGNOSIS — E039 Hypothyroidism, unspecified: Secondary | ICD-10-CM | POA: Diagnosis not present

## 2017-11-09 DIAGNOSIS — I10 Essential (primary) hypertension: Secondary | ICD-10-CM | POA: Diagnosis not present

## 2017-11-09 DIAGNOSIS — N189 Chronic kidney disease, unspecified: Secondary | ICD-10-CM | POA: Diagnosis not present

## 2017-11-09 DIAGNOSIS — E785 Hyperlipidemia, unspecified: Secondary | ICD-10-CM | POA: Diagnosis not present

## 2017-11-24 DIAGNOSIS — E569 Vitamin deficiency, unspecified: Secondary | ICD-10-CM | POA: Diagnosis not present

## 2017-11-24 DIAGNOSIS — M6281 Muscle weakness (generalized): Secondary | ICD-10-CM | POA: Diagnosis not present

## 2017-11-24 DIAGNOSIS — I10 Essential (primary) hypertension: Secondary | ICD-10-CM | POA: Diagnosis not present

## 2017-11-24 DIAGNOSIS — Z0000 Encounter for general adult medical examination without abnormal findings: Secondary | ICD-10-CM | POA: Diagnosis not present

## 2017-11-24 DIAGNOSIS — E039 Hypothyroidism, unspecified: Secondary | ICD-10-CM | POA: Diagnosis not present

## 2017-12-05 DIAGNOSIS — E559 Vitamin D deficiency, unspecified: Secondary | ICD-10-CM | POA: Diagnosis not present

## 2017-12-05 DIAGNOSIS — E038 Other specified hypothyroidism: Secondary | ICD-10-CM | POA: Diagnosis not present

## 2017-12-05 DIAGNOSIS — E7849 Other hyperlipidemia: Secondary | ICD-10-CM | POA: Diagnosis not present

## 2017-12-05 DIAGNOSIS — E119 Type 2 diabetes mellitus without complications: Secondary | ICD-10-CM | POA: Diagnosis not present

## 2017-12-05 DIAGNOSIS — Z79899 Other long term (current) drug therapy: Secondary | ICD-10-CM | POA: Diagnosis not present

## 2017-12-05 DIAGNOSIS — D518 Other vitamin B12 deficiency anemias: Secondary | ICD-10-CM | POA: Diagnosis not present

## 2017-12-08 DIAGNOSIS — N189 Chronic kidney disease, unspecified: Secondary | ICD-10-CM | POA: Diagnosis not present

## 2017-12-08 DIAGNOSIS — E785 Hyperlipidemia, unspecified: Secondary | ICD-10-CM | POA: Diagnosis not present

## 2017-12-08 DIAGNOSIS — I10 Essential (primary) hypertension: Secondary | ICD-10-CM | POA: Diagnosis not present

## 2017-12-08 DIAGNOSIS — E039 Hypothyroidism, unspecified: Secondary | ICD-10-CM | POA: Diagnosis not present

## 2017-12-12 DIAGNOSIS — R410 Disorientation, unspecified: Secondary | ICD-10-CM | POA: Diagnosis not present

## 2017-12-16 DIAGNOSIS — F0280 Dementia in other diseases classified elsewhere without behavioral disturbance: Secondary | ICD-10-CM | POA: Diagnosis not present

## 2017-12-16 DIAGNOSIS — N390 Urinary tract infection, site not specified: Secondary | ICD-10-CM | POA: Diagnosis not present

## 2017-12-16 DIAGNOSIS — N183 Chronic kidney disease, stage 3 (moderate): Secondary | ICD-10-CM | POA: Diagnosis not present

## 2017-12-16 DIAGNOSIS — H409 Unspecified glaucoma: Secondary | ICD-10-CM | POA: Diagnosis not present

## 2017-12-16 DIAGNOSIS — I129 Hypertensive chronic kidney disease with stage 1 through stage 4 chronic kidney disease, or unspecified chronic kidney disease: Secondary | ICD-10-CM | POA: Diagnosis not present

## 2017-12-16 DIAGNOSIS — E039 Hypothyroidism, unspecified: Secondary | ICD-10-CM | POA: Diagnosis not present

## 2017-12-17 DIAGNOSIS — N390 Urinary tract infection, site not specified: Secondary | ICD-10-CM | POA: Diagnosis not present

## 2017-12-17 DIAGNOSIS — E039 Hypothyroidism, unspecified: Secondary | ICD-10-CM | POA: Diagnosis not present

## 2017-12-17 DIAGNOSIS — H409 Unspecified glaucoma: Secondary | ICD-10-CM | POA: Diagnosis not present

## 2017-12-17 DIAGNOSIS — F0280 Dementia in other diseases classified elsewhere without behavioral disturbance: Secondary | ICD-10-CM | POA: Diagnosis not present

## 2017-12-17 DIAGNOSIS — I129 Hypertensive chronic kidney disease with stage 1 through stage 4 chronic kidney disease, or unspecified chronic kidney disease: Secondary | ICD-10-CM | POA: Diagnosis not present

## 2017-12-17 DIAGNOSIS — N183 Chronic kidney disease, stage 3 (moderate): Secondary | ICD-10-CM | POA: Diagnosis not present

## 2017-12-18 DIAGNOSIS — I129 Hypertensive chronic kidney disease with stage 1 through stage 4 chronic kidney disease, or unspecified chronic kidney disease: Secondary | ICD-10-CM | POA: Diagnosis not present

## 2017-12-18 DIAGNOSIS — N183 Chronic kidney disease, stage 3 (moderate): Secondary | ICD-10-CM | POA: Diagnosis not present

## 2017-12-18 DIAGNOSIS — H409 Unspecified glaucoma: Secondary | ICD-10-CM | POA: Diagnosis not present

## 2017-12-18 DIAGNOSIS — N390 Urinary tract infection, site not specified: Secondary | ICD-10-CM | POA: Diagnosis not present

## 2017-12-18 DIAGNOSIS — F0280 Dementia in other diseases classified elsewhere without behavioral disturbance: Secondary | ICD-10-CM | POA: Diagnosis not present

## 2017-12-18 DIAGNOSIS — E039 Hypothyroidism, unspecified: Secondary | ICD-10-CM | POA: Diagnosis not present

## 2017-12-19 DIAGNOSIS — N183 Chronic kidney disease, stage 3 (moderate): Secondary | ICD-10-CM | POA: Diagnosis not present

## 2017-12-19 DIAGNOSIS — N390 Urinary tract infection, site not specified: Secondary | ICD-10-CM | POA: Diagnosis not present

## 2017-12-19 DIAGNOSIS — H409 Unspecified glaucoma: Secondary | ICD-10-CM | POA: Diagnosis not present

## 2017-12-19 DIAGNOSIS — F0280 Dementia in other diseases classified elsewhere without behavioral disturbance: Secondary | ICD-10-CM | POA: Diagnosis not present

## 2017-12-19 DIAGNOSIS — E039 Hypothyroidism, unspecified: Secondary | ICD-10-CM | POA: Diagnosis not present

## 2017-12-19 DIAGNOSIS — I129 Hypertensive chronic kidney disease with stage 1 through stage 4 chronic kidney disease, or unspecified chronic kidney disease: Secondary | ICD-10-CM | POA: Diagnosis not present

## 2017-12-20 DIAGNOSIS — F0280 Dementia in other diseases classified elsewhere without behavioral disturbance: Secondary | ICD-10-CM | POA: Diagnosis not present

## 2017-12-20 DIAGNOSIS — E039 Hypothyroidism, unspecified: Secondary | ICD-10-CM | POA: Diagnosis not present

## 2017-12-20 DIAGNOSIS — I129 Hypertensive chronic kidney disease with stage 1 through stage 4 chronic kidney disease, or unspecified chronic kidney disease: Secondary | ICD-10-CM | POA: Diagnosis not present

## 2017-12-20 DIAGNOSIS — N390 Urinary tract infection, site not specified: Secondary | ICD-10-CM | POA: Diagnosis not present

## 2017-12-20 DIAGNOSIS — N183 Chronic kidney disease, stage 3 (moderate): Secondary | ICD-10-CM | POA: Diagnosis not present

## 2017-12-20 DIAGNOSIS — H409 Unspecified glaucoma: Secondary | ICD-10-CM | POA: Diagnosis not present

## 2017-12-22 DIAGNOSIS — R300 Dysuria: Secondary | ICD-10-CM | POA: Diagnosis not present

## 2017-12-22 DIAGNOSIS — N390 Urinary tract infection, site not specified: Secondary | ICD-10-CM | POA: Diagnosis not present

## 2017-12-28 DIAGNOSIS — H409 Unspecified glaucoma: Secondary | ICD-10-CM | POA: Diagnosis not present

## 2017-12-28 DIAGNOSIS — N390 Urinary tract infection, site not specified: Secondary | ICD-10-CM | POA: Diagnosis not present

## 2017-12-28 DIAGNOSIS — N183 Chronic kidney disease, stage 3 (moderate): Secondary | ICD-10-CM | POA: Diagnosis not present

## 2017-12-28 DIAGNOSIS — F0280 Dementia in other diseases classified elsewhere without behavioral disturbance: Secondary | ICD-10-CM | POA: Diagnosis not present

## 2017-12-28 DIAGNOSIS — E039 Hypothyroidism, unspecified: Secondary | ICD-10-CM | POA: Diagnosis not present

## 2017-12-28 DIAGNOSIS — I129 Hypertensive chronic kidney disease with stage 1 through stage 4 chronic kidney disease, or unspecified chronic kidney disease: Secondary | ICD-10-CM | POA: Diagnosis not present

## 2018-01-05 DIAGNOSIS — I10 Essential (primary) hypertension: Secondary | ICD-10-CM | POA: Diagnosis not present

## 2018-01-05 DIAGNOSIS — N390 Urinary tract infection, site not specified: Secondary | ICD-10-CM | POA: Diagnosis not present

## 2018-01-05 DIAGNOSIS — H409 Unspecified glaucoma: Secondary | ICD-10-CM | POA: Diagnosis not present

## 2018-01-05 DIAGNOSIS — E785 Hyperlipidemia, unspecified: Secondary | ICD-10-CM | POA: Diagnosis not present

## 2018-01-05 DIAGNOSIS — N183 Chronic kidney disease, stage 3 (moderate): Secondary | ICD-10-CM | POA: Diagnosis not present

## 2018-01-05 DIAGNOSIS — F0280 Dementia in other diseases classified elsewhere without behavioral disturbance: Secondary | ICD-10-CM | POA: Diagnosis not present

## 2018-01-05 DIAGNOSIS — I129 Hypertensive chronic kidney disease with stage 1 through stage 4 chronic kidney disease, or unspecified chronic kidney disease: Secondary | ICD-10-CM | POA: Diagnosis not present

## 2018-01-05 DIAGNOSIS — N189 Chronic kidney disease, unspecified: Secondary | ICD-10-CM | POA: Diagnosis not present

## 2018-01-05 DIAGNOSIS — E039 Hypothyroidism, unspecified: Secondary | ICD-10-CM | POA: Diagnosis not present

## 2018-01-10 DIAGNOSIS — N183 Chronic kidney disease, stage 3 (moderate): Secondary | ICD-10-CM | POA: Diagnosis not present

## 2018-01-10 DIAGNOSIS — N390 Urinary tract infection, site not specified: Secondary | ICD-10-CM | POA: Diagnosis not present

## 2018-01-10 DIAGNOSIS — E039 Hypothyroidism, unspecified: Secondary | ICD-10-CM | POA: Diagnosis not present

## 2018-01-10 DIAGNOSIS — I129 Hypertensive chronic kidney disease with stage 1 through stage 4 chronic kidney disease, or unspecified chronic kidney disease: Secondary | ICD-10-CM | POA: Diagnosis not present

## 2018-01-10 DIAGNOSIS — H409 Unspecified glaucoma: Secondary | ICD-10-CM | POA: Diagnosis not present

## 2018-01-10 DIAGNOSIS — F0280 Dementia in other diseases classified elsewhere without behavioral disturbance: Secondary | ICD-10-CM | POA: Diagnosis not present

## 2018-02-02 DIAGNOSIS — I10 Essential (primary) hypertension: Secondary | ICD-10-CM | POA: Diagnosis not present

## 2018-02-02 DIAGNOSIS — E785 Hyperlipidemia, unspecified: Secondary | ICD-10-CM | POA: Diagnosis not present

## 2018-02-02 DIAGNOSIS — E039 Hypothyroidism, unspecified: Secondary | ICD-10-CM | POA: Diagnosis not present

## 2018-02-02 DIAGNOSIS — N189 Chronic kidney disease, unspecified: Secondary | ICD-10-CM | POA: Diagnosis not present

## 2018-02-21 DIAGNOSIS — R300 Dysuria: Secondary | ICD-10-CM | POA: Diagnosis not present

## 2018-02-23 DIAGNOSIS — R609 Edema, unspecified: Secondary | ICD-10-CM | POA: Diagnosis not present

## 2018-02-23 DIAGNOSIS — N189 Chronic kidney disease, unspecified: Secondary | ICD-10-CM | POA: Diagnosis not present

## 2018-02-27 DIAGNOSIS — I10 Essential (primary) hypertension: Secondary | ICD-10-CM | POA: Diagnosis not present

## 2018-02-27 DIAGNOSIS — Z87440 Personal history of urinary (tract) infections: Secondary | ICD-10-CM | POA: Diagnosis not present

## 2018-02-28 DIAGNOSIS — N189 Chronic kidney disease, unspecified: Secondary | ICD-10-CM | POA: Diagnosis not present

## 2018-02-28 DIAGNOSIS — E039 Hypothyroidism, unspecified: Secondary | ICD-10-CM | POA: Diagnosis not present

## 2018-02-28 DIAGNOSIS — F0391 Unspecified dementia with behavioral disturbance: Secondary | ICD-10-CM | POA: Diagnosis not present

## 2018-02-28 DIAGNOSIS — I10 Essential (primary) hypertension: Secondary | ICD-10-CM | POA: Diagnosis not present

## 2018-03-01 DIAGNOSIS — E559 Vitamin D deficiency, unspecified: Secondary | ICD-10-CM | POA: Diagnosis not present

## 2018-03-01 DIAGNOSIS — D518 Other vitamin B12 deficiency anemias: Secondary | ICD-10-CM | POA: Diagnosis not present

## 2018-03-01 DIAGNOSIS — E038 Other specified hypothyroidism: Secondary | ICD-10-CM | POA: Diagnosis not present

## 2018-03-01 DIAGNOSIS — E7849 Other hyperlipidemia: Secondary | ICD-10-CM | POA: Diagnosis not present

## 2018-03-01 DIAGNOSIS — E119 Type 2 diabetes mellitus without complications: Secondary | ICD-10-CM | POA: Diagnosis not present

## 2018-03-01 DIAGNOSIS — Z79899 Other long term (current) drug therapy: Secondary | ICD-10-CM | POA: Diagnosis not present

## 2018-03-09 DIAGNOSIS — N390 Urinary tract infection, site not specified: Secondary | ICD-10-CM | POA: Diagnosis not present

## 2018-03-09 DIAGNOSIS — F0391 Unspecified dementia with behavioral disturbance: Secondary | ICD-10-CM | POA: Diagnosis not present

## 2018-03-09 DIAGNOSIS — R05 Cough: Secondary | ICD-10-CM | POA: Diagnosis not present

## 2018-03-10 DIAGNOSIS — R300 Dysuria: Secondary | ICD-10-CM | POA: Diagnosis not present

## 2018-03-10 DIAGNOSIS — Z87440 Personal history of urinary (tract) infections: Secondary | ICD-10-CM | POA: Diagnosis not present

## 2018-03-23 DIAGNOSIS — E039 Hypothyroidism, unspecified: Secondary | ICD-10-CM | POA: Diagnosis not present

## 2018-03-23 DIAGNOSIS — N189 Chronic kidney disease, unspecified: Secondary | ICD-10-CM | POA: Diagnosis not present

## 2018-03-23 DIAGNOSIS — E785 Hyperlipidemia, unspecified: Secondary | ICD-10-CM | POA: Diagnosis not present

## 2018-03-23 DIAGNOSIS — I10 Essential (primary) hypertension: Secondary | ICD-10-CM | POA: Diagnosis not present

## 2018-03-31 DIAGNOSIS — F0391 Unspecified dementia with behavioral disturbance: Secondary | ICD-10-CM | POA: Diagnosis not present

## 2018-04-18 DIAGNOSIS — I10 Essential (primary) hypertension: Secondary | ICD-10-CM | POA: Diagnosis not present

## 2018-04-18 DIAGNOSIS — R05 Cough: Secondary | ICD-10-CM | POA: Diagnosis not present

## 2018-04-18 DIAGNOSIS — R4689 Other symptoms and signs involving appearance and behavior: Secondary | ICD-10-CM | POA: Diagnosis not present

## 2018-04-18 DIAGNOSIS — E785 Hyperlipidemia, unspecified: Secondary | ICD-10-CM | POA: Diagnosis not present

## 2018-04-18 DIAGNOSIS — N189 Chronic kidney disease, unspecified: Secondary | ICD-10-CM | POA: Diagnosis not present

## 2018-04-18 DIAGNOSIS — N390 Urinary tract infection, site not specified: Secondary | ICD-10-CM | POA: Diagnosis not present

## 2018-04-18 DIAGNOSIS — E039 Hypothyroidism, unspecified: Secondary | ICD-10-CM | POA: Diagnosis not present

## 2018-04-22 DIAGNOSIS — Z882 Allergy status to sulfonamides status: Secondary | ICD-10-CM | POA: Diagnosis not present

## 2018-04-22 DIAGNOSIS — Z91030 Bee allergy status: Secondary | ICD-10-CM | POA: Diagnosis not present

## 2018-04-22 DIAGNOSIS — Z881 Allergy status to other antibiotic agents status: Secondary | ICD-10-CM | POA: Diagnosis not present

## 2018-04-22 DIAGNOSIS — F0390 Unspecified dementia without behavioral disturbance: Secondary | ICD-10-CM | POA: Diagnosis not present

## 2018-04-22 DIAGNOSIS — K5790 Diverticulosis of intestine, part unspecified, without perforation or abscess without bleeding: Secondary | ICD-10-CM | POA: Diagnosis not present

## 2018-04-22 DIAGNOSIS — Z885 Allergy status to narcotic agent status: Secondary | ICD-10-CM | POA: Diagnosis not present

## 2018-04-22 DIAGNOSIS — Z79899 Other long term (current) drug therapy: Secondary | ICD-10-CM | POA: Diagnosis not present

## 2018-04-22 DIAGNOSIS — K449 Diaphragmatic hernia without obstruction or gangrene: Secondary | ICD-10-CM | POA: Diagnosis not present

## 2018-04-22 DIAGNOSIS — I10 Essential (primary) hypertension: Secondary | ICD-10-CM | POA: Diagnosis not present

## 2018-04-22 DIAGNOSIS — R319 Hematuria, unspecified: Secondary | ICD-10-CM | POA: Diagnosis not present

## 2018-04-22 DIAGNOSIS — R309 Painful micturition, unspecified: Secondary | ICD-10-CM | POA: Diagnosis not present

## 2018-04-22 DIAGNOSIS — R279 Unspecified lack of coordination: Secondary | ICD-10-CM | POA: Diagnosis not present

## 2018-04-22 DIAGNOSIS — R1084 Generalized abdominal pain: Secondary | ICD-10-CM | POA: Diagnosis not present

## 2018-04-22 DIAGNOSIS — Z888 Allergy status to other drugs, medicaments and biological substances status: Secondary | ICD-10-CM | POA: Diagnosis not present

## 2018-04-22 DIAGNOSIS — N390 Urinary tract infection, site not specified: Secondary | ICD-10-CM | POA: Diagnosis not present

## 2018-04-22 DIAGNOSIS — E785 Hyperlipidemia, unspecified: Secondary | ICD-10-CM | POA: Diagnosis not present

## 2018-04-22 DIAGNOSIS — Z880 Allergy status to penicillin: Secondary | ICD-10-CM | POA: Diagnosis not present

## 2018-04-22 DIAGNOSIS — Z96659 Presence of unspecified artificial knee joint: Secondary | ICD-10-CM | POA: Diagnosis not present

## 2018-04-22 DIAGNOSIS — Z743 Need for continuous supervision: Secondary | ICD-10-CM | POA: Diagnosis not present

## 2018-04-22 DIAGNOSIS — K5900 Constipation, unspecified: Secondary | ICD-10-CM | POA: Diagnosis not present

## 2018-04-22 DIAGNOSIS — R109 Unspecified abdominal pain: Secondary | ICD-10-CM | POA: Diagnosis not present

## 2018-04-22 DIAGNOSIS — Z7982 Long term (current) use of aspirin: Secondary | ICD-10-CM | POA: Diagnosis not present

## 2018-04-25 DIAGNOSIS — D518 Other vitamin B12 deficiency anemias: Secondary | ICD-10-CM | POA: Diagnosis not present

## 2018-04-25 DIAGNOSIS — E119 Type 2 diabetes mellitus without complications: Secondary | ICD-10-CM | POA: Diagnosis not present

## 2018-04-25 DIAGNOSIS — E7849 Other hyperlipidemia: Secondary | ICD-10-CM | POA: Diagnosis not present

## 2018-04-25 DIAGNOSIS — Z79899 Other long term (current) drug therapy: Secondary | ICD-10-CM | POA: Diagnosis not present

## 2018-05-16 DIAGNOSIS — F0391 Unspecified dementia with behavioral disturbance: Secondary | ICD-10-CM | POA: Diagnosis not present

## 2018-05-16 DIAGNOSIS — R300 Dysuria: Secondary | ICD-10-CM | POA: Diagnosis not present

## 2018-05-16 DIAGNOSIS — N390 Urinary tract infection, site not specified: Secondary | ICD-10-CM | POA: Diagnosis not present

## 2018-05-16 DIAGNOSIS — E039 Hypothyroidism, unspecified: Secondary | ICD-10-CM | POA: Diagnosis not present

## 2018-05-16 DIAGNOSIS — E785 Hyperlipidemia, unspecified: Secondary | ICD-10-CM | POA: Diagnosis not present

## 2018-05-24 DIAGNOSIS — E038 Other specified hypothyroidism: Secondary | ICD-10-CM | POA: Diagnosis not present

## 2018-05-24 DIAGNOSIS — E119 Type 2 diabetes mellitus without complications: Secondary | ICD-10-CM | POA: Diagnosis not present

## 2018-05-24 DIAGNOSIS — D518 Other vitamin B12 deficiency anemias: Secondary | ICD-10-CM | POA: Diagnosis not present

## 2018-05-25 DIAGNOSIS — F0391 Unspecified dementia with behavioral disturbance: Secondary | ICD-10-CM | POA: Diagnosis not present

## 2018-05-25 DIAGNOSIS — N183 Chronic kidney disease, stage 3 (moderate): Secondary | ICD-10-CM | POA: Diagnosis not present

## 2018-06-07 DIAGNOSIS — I6782 Cerebral ischemia: Secondary | ICD-10-CM | POA: Diagnosis not present

## 2018-06-07 DIAGNOSIS — F0390 Unspecified dementia without behavioral disturbance: Secondary | ICD-10-CM | POA: Diagnosis not present

## 2018-06-12 DIAGNOSIS — E559 Vitamin D deficiency, unspecified: Secondary | ICD-10-CM | POA: Diagnosis not present

## 2018-06-12 DIAGNOSIS — Z79899 Other long term (current) drug therapy: Secondary | ICD-10-CM | POA: Diagnosis not present

## 2018-06-12 DIAGNOSIS — E119 Type 2 diabetes mellitus without complications: Secondary | ICD-10-CM | POA: Diagnosis not present

## 2018-06-12 DIAGNOSIS — E038 Other specified hypothyroidism: Secondary | ICD-10-CM | POA: Diagnosis not present

## 2018-06-12 DIAGNOSIS — D518 Other vitamin B12 deficiency anemias: Secondary | ICD-10-CM | POA: Diagnosis not present

## 2018-06-12 DIAGNOSIS — E7849 Other hyperlipidemia: Secondary | ICD-10-CM | POA: Diagnosis not present

## 2018-06-13 DIAGNOSIS — N390 Urinary tract infection, site not specified: Secondary | ICD-10-CM | POA: Diagnosis not present

## 2018-06-13 DIAGNOSIS — I10 Essential (primary) hypertension: Secondary | ICD-10-CM | POA: Diagnosis not present

## 2018-06-13 DIAGNOSIS — N183 Chronic kidney disease, stage 3 (moderate): Secondary | ICD-10-CM | POA: Diagnosis not present

## 2018-06-13 DIAGNOSIS — E785 Hyperlipidemia, unspecified: Secondary | ICD-10-CM | POA: Diagnosis not present

## 2018-07-11 DIAGNOSIS — E039 Hypothyroidism, unspecified: Secondary | ICD-10-CM | POA: Diagnosis not present

## 2018-07-11 DIAGNOSIS — E785 Hyperlipidemia, unspecified: Secondary | ICD-10-CM | POA: Diagnosis not present

## 2018-07-11 DIAGNOSIS — N183 Chronic kidney disease, stage 3 (moderate): Secondary | ICD-10-CM | POA: Diagnosis not present

## 2018-07-11 DIAGNOSIS — I10 Essential (primary) hypertension: Secondary | ICD-10-CM | POA: Diagnosis not present

## 2018-07-13 DIAGNOSIS — E119 Type 2 diabetes mellitus without complications: Secondary | ICD-10-CM | POA: Diagnosis not present

## 2018-07-13 DIAGNOSIS — D518 Other vitamin B12 deficiency anemias: Secondary | ICD-10-CM | POA: Diagnosis not present

## 2018-07-13 DIAGNOSIS — E038 Other specified hypothyroidism: Secondary | ICD-10-CM | POA: Diagnosis not present

## 2018-08-08 DIAGNOSIS — N183 Chronic kidney disease, stage 3 (moderate): Secondary | ICD-10-CM | POA: Diagnosis not present

## 2018-08-08 DIAGNOSIS — I10 Essential (primary) hypertension: Secondary | ICD-10-CM | POA: Diagnosis not present

## 2018-08-08 DIAGNOSIS — E785 Hyperlipidemia, unspecified: Secondary | ICD-10-CM | POA: Diagnosis not present

## 2018-08-08 DIAGNOSIS — E039 Hypothyroidism, unspecified: Secondary | ICD-10-CM | POA: Diagnosis not present

## 2018-08-15 DIAGNOSIS — D518 Other vitamin B12 deficiency anemias: Secondary | ICD-10-CM | POA: Diagnosis not present

## 2018-08-15 DIAGNOSIS — E038 Other specified hypothyroidism: Secondary | ICD-10-CM | POA: Diagnosis not present

## 2018-08-15 DIAGNOSIS — E119 Type 2 diabetes mellitus without complications: Secondary | ICD-10-CM | POA: Diagnosis not present

## 2018-08-29 DIAGNOSIS — I10 Essential (primary) hypertension: Secondary | ICD-10-CM | POA: Diagnosis not present

## 2018-08-29 DIAGNOSIS — E569 Vitamin deficiency, unspecified: Secondary | ICD-10-CM | POA: Diagnosis not present

## 2018-08-29 DIAGNOSIS — E785 Hyperlipidemia, unspecified: Secondary | ICD-10-CM | POA: Diagnosis not present

## 2018-08-29 DIAGNOSIS — E039 Hypothyroidism, unspecified: Secondary | ICD-10-CM | POA: Diagnosis not present

## 2018-09-05 DIAGNOSIS — B351 Tinea unguium: Secondary | ICD-10-CM | POA: Diagnosis not present

## 2018-09-05 DIAGNOSIS — M79675 Pain in left toe(s): Secondary | ICD-10-CM | POA: Diagnosis not present

## 2018-09-05 DIAGNOSIS — M79674 Pain in right toe(s): Secondary | ICD-10-CM | POA: Diagnosis not present

## 2018-09-11 DIAGNOSIS — E038 Other specified hypothyroidism: Secondary | ICD-10-CM | POA: Diagnosis not present

## 2018-09-11 DIAGNOSIS — D518 Other vitamin B12 deficiency anemias: Secondary | ICD-10-CM | POA: Diagnosis not present

## 2018-09-11 DIAGNOSIS — E7849 Other hyperlipidemia: Secondary | ICD-10-CM | POA: Diagnosis not present

## 2018-09-11 DIAGNOSIS — E559 Vitamin D deficiency, unspecified: Secondary | ICD-10-CM | POA: Diagnosis not present

## 2018-09-11 DIAGNOSIS — E119 Type 2 diabetes mellitus without complications: Secondary | ICD-10-CM | POA: Diagnosis not present

## 2018-09-11 DIAGNOSIS — Z79899 Other long term (current) drug therapy: Secondary | ICD-10-CM | POA: Diagnosis not present

## 2018-09-14 DIAGNOSIS — E119 Type 2 diabetes mellitus without complications: Secondary | ICD-10-CM | POA: Diagnosis not present

## 2018-09-14 DIAGNOSIS — D518 Other vitamin B12 deficiency anemias: Secondary | ICD-10-CM | POA: Diagnosis not present

## 2018-09-14 DIAGNOSIS — E038 Other specified hypothyroidism: Secondary | ICD-10-CM | POA: Diagnosis not present

## 2018-09-16 DIAGNOSIS — I2510 Atherosclerotic heart disease of native coronary artery without angina pectoris: Secondary | ICD-10-CM | POA: Diagnosis not present

## 2018-09-16 DIAGNOSIS — F331 Major depressive disorder, recurrent, moderate: Secondary | ICD-10-CM | POA: Diagnosis not present

## 2018-09-19 DIAGNOSIS — Z23 Encounter for immunization: Secondary | ICD-10-CM | POA: Diagnosis not present

## 2018-09-20 IMAGING — MR MR HEAD W/O CM
10 series · 48 of 48 positions shown · non-contrast
Comparison: None.

CLINICAL DATA: Syncopal episode.  Hypertension.  Dementia.

EXAM:
MRI HEAD WITHOUT CONTRAST
TECHNIQUE: Multiplanar, multiecho pulse sequences of the brain and surrounding
structures were obtained without intravenous contrast.

[Series 5: T1 · sagittal · 4.0mm · 0.75mm/px · 4 of 31 slices shown (1 of 2)]
[im 1/31]
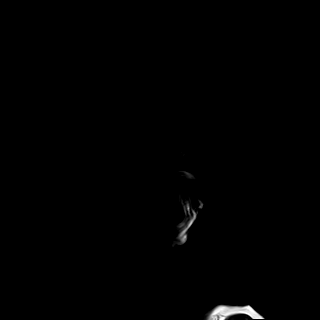
[im 11/31]
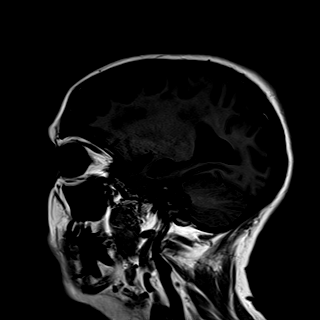
[im 21/31]
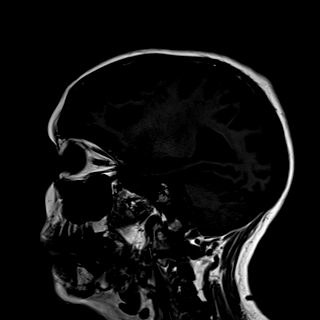
[im 31/31]
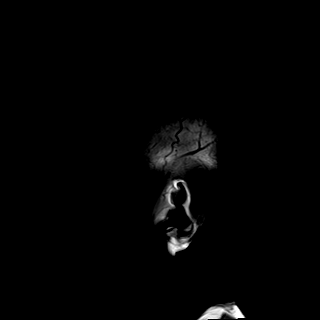

[Series 6: DWI · axial · 3.0mm · 1.44mm/px · z∈[-23,+105]mm · 8 of 80 slices shown (1 of 4)]
[im 1/80]
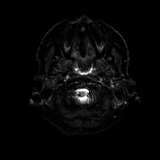
[im 12/80]
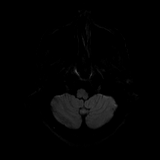
[im 23/80]
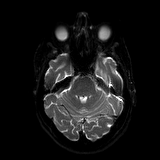
[im 34/80]
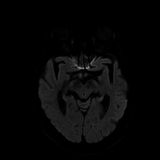
[im 46/80]
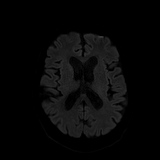
[im 57/80]
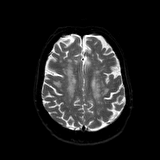
[im 68/80]
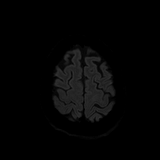
[im 80/80]
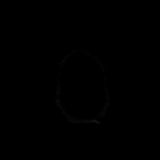

[Series 7: DWI · axial · 3.0mm · 1.44mm/px · z∈[-23,+105]mm · 3 of 40 slices shown (2 of 4)]
[im 1/40]
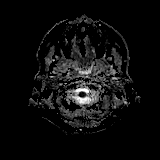
[im 20/40]
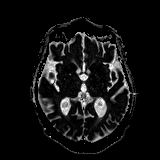
[im 40/40]
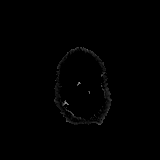

[Series 8: DWI · coronal · 5.0mm · 1.44mm/px · 5 of 56 slices shown (3 of 4)]
[im 1/56]
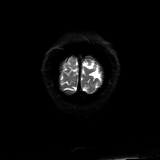
[im 14/56]
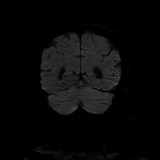
[im 28/56]
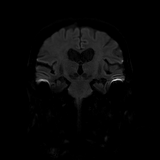
[im 42/56]
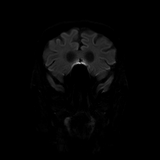
[im 56/56]
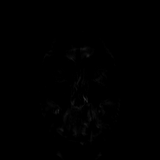

[Series 9: DWI · coronal · 5.0mm · 1.44mm/px · 2 of 28 slices shown (4 of 4)]
[im 1/28]
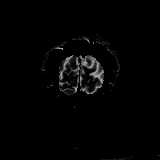
[im 28/28]
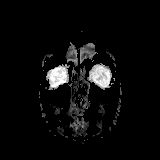

[Series 10: T2 · axial · 4.0mm · 0.36mm/px · z∈[-34,+101]mm · 2 of 27 slices shown (1 of 2)]
[im 1/27]
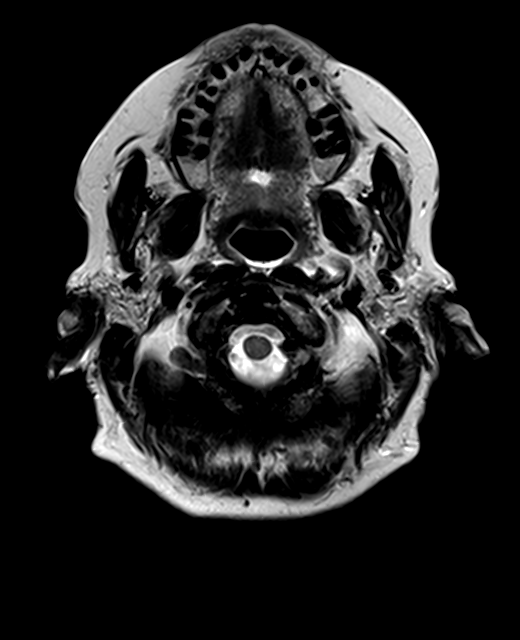
[im 27/27]
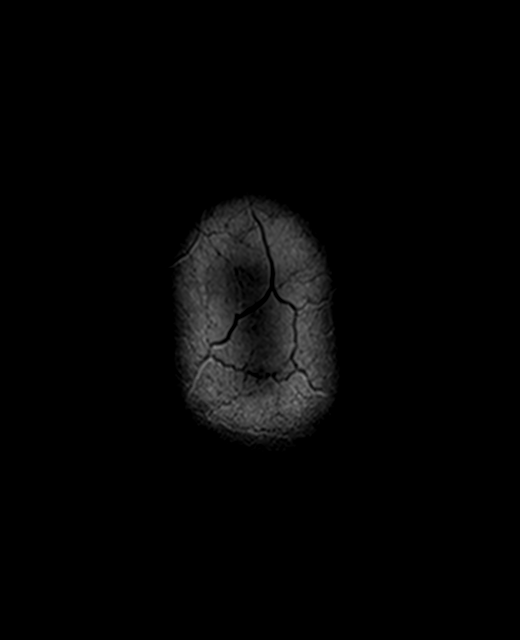

[Series 11: FLAIR · axial · 4.0mm · 0.72mm/px · z∈[-33,+102]mm · 2 of 27 slices shown]
[im 1/27]
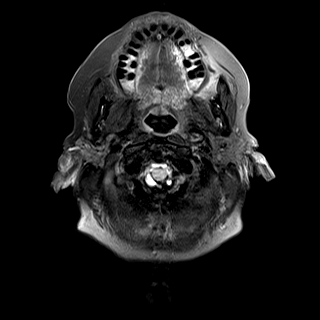
[im 27/27]
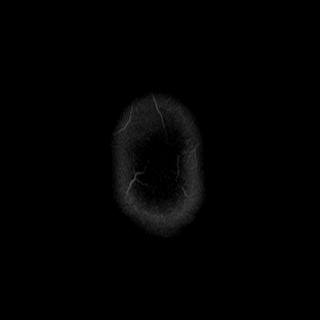

[Series 13: swi_images · axial · 1.5mm · 0.90mm/px · z∈[-38,+104]mm · 8 of 96 slices shown]
[im 1/96]
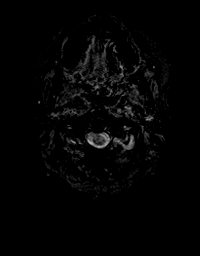
[im 14/96]
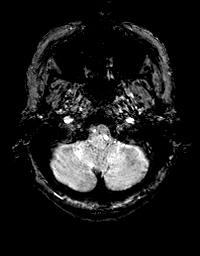
[im 28/96]
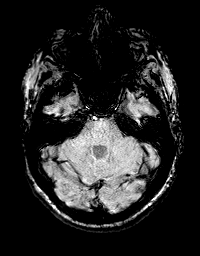
[im 41/96]
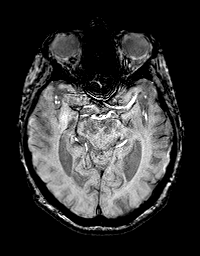
[im 55/96]
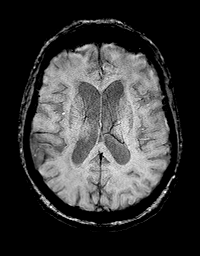
[im 68/96]
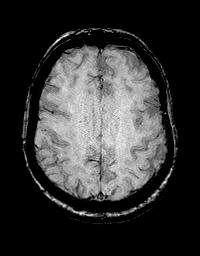
[im 82/96]
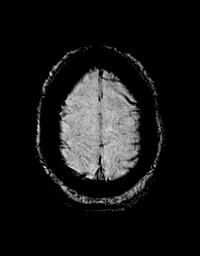
[im 96/96]
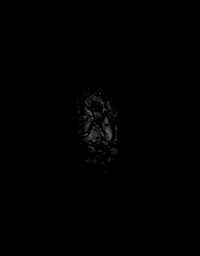

[Series 14: T1 · axial · 1.0mm · 0.90mm/px · z∈[-38,+104]mm · 12 of 144 slices shown (2 of 2)]
[im 1/144]
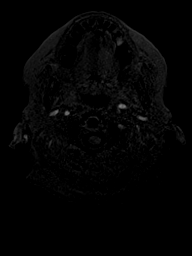
[im 14/144]
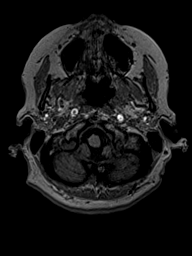
[im 27/144]
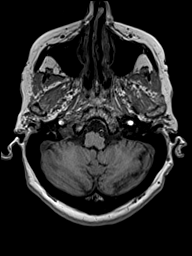
[im 40/144]
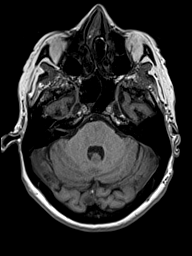
[im 53/144]
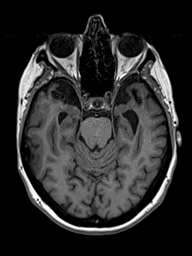
[im 66/144]
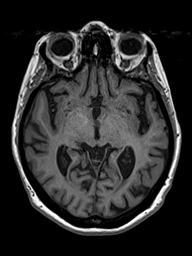
[im 79/144]
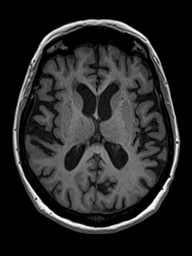
[im 92/144]
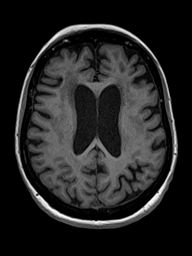
[im 105/144]
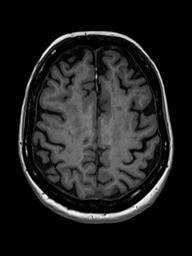
[im 118/144]
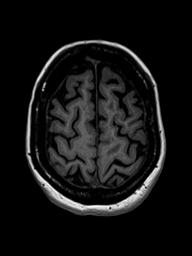
[im 131/144]
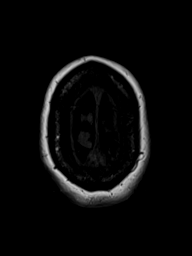
[im 144/144]
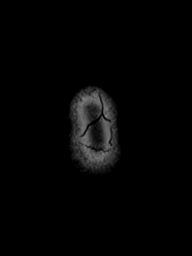

[Series 15: T2 · coronal · 4.5mm · 0.36mm/px · 2 of 29 slices shown (2 of 2)]
[im 1/29]
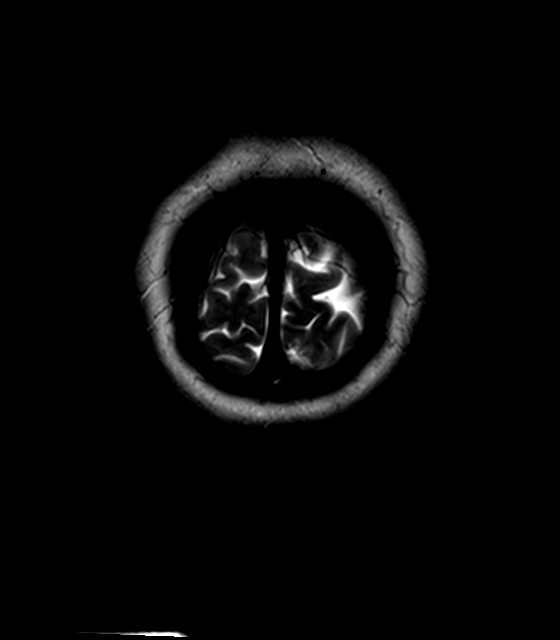
[im 29/29]
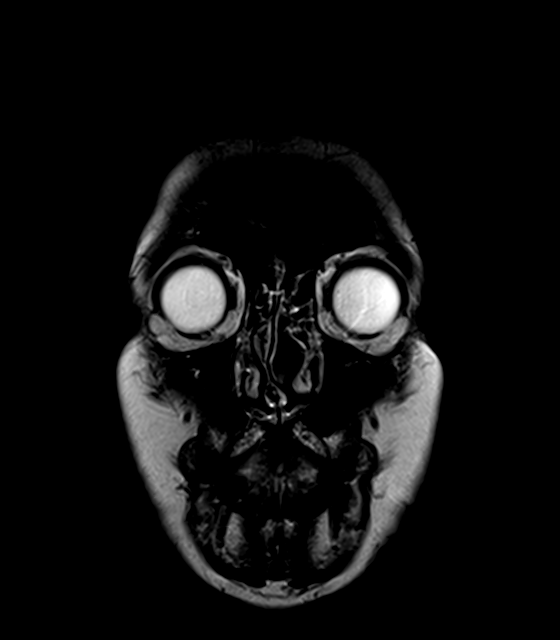

[48 of 48 positions shown; findings below may reference images not displayed]

FINDINGS: Brain: Diffusion imaging does not show any acute or subacute
infarction. The brainstem and cerebellum are normal. There are old
small vessel ischemic changes affecting the thalami in the cerebral
hemispheric deep and subcortical white matter. No cortical or large
vessel territory infarction. No mass lesion, hemorrhage,
hydrocephalus or extra-axial collection. No pituitary mass.

Vascular: Major vessels at the base of the brain show flow.

Skull and upper cervical spine: Negative

Sinuses/Orbits: Clear/normal

Other: None significant
IMPRESSION: No acute or reversible finding. Chronic small-vessel ischemic
changes affecting the thalami and cerebral hemispheric white matter.

## 2018-10-10 DIAGNOSIS — R011 Cardiac murmur, unspecified: Secondary | ICD-10-CM | POA: Diagnosis not present

## 2018-10-18 DIAGNOSIS — E038 Other specified hypothyroidism: Secondary | ICD-10-CM | POA: Diagnosis not present

## 2018-10-18 DIAGNOSIS — D518 Other vitamin B12 deficiency anemias: Secondary | ICD-10-CM | POA: Diagnosis not present

## 2018-10-18 DIAGNOSIS — E119 Type 2 diabetes mellitus without complications: Secondary | ICD-10-CM | POA: Diagnosis not present

## 2018-10-23 DIAGNOSIS — R410 Disorientation, unspecified: Secondary | ICD-10-CM | POA: Diagnosis not present

## 2018-10-25 DIAGNOSIS — L400 Psoriasis vulgaris: Secondary | ICD-10-CM | POA: Diagnosis not present

## 2018-11-09 DIAGNOSIS — I10 Essential (primary) hypertension: Secondary | ICD-10-CM | POA: Diagnosis not present

## 2018-11-09 DIAGNOSIS — E039 Hypothyroidism, unspecified: Secondary | ICD-10-CM | POA: Diagnosis not present

## 2018-11-09 DIAGNOSIS — G301 Alzheimer's disease with late onset: Secondary | ICD-10-CM | POA: Diagnosis not present

## 2018-11-09 DIAGNOSIS — N183 Chronic kidney disease, stage 3 (moderate): Secondary | ICD-10-CM | POA: Diagnosis not present

## 2018-11-10 DIAGNOSIS — R300 Dysuria: Secondary | ICD-10-CM | POA: Diagnosis not present

## 2018-11-28 DIAGNOSIS — I714 Abdominal aortic aneurysm, without rupture: Secondary | ICD-10-CM | POA: Diagnosis not present

## 2018-11-28 DIAGNOSIS — R221 Localized swelling, mass and lump, neck: Secondary | ICD-10-CM | POA: Diagnosis not present

## 2018-11-28 DIAGNOSIS — M79609 Pain in unspecified limb: Secondary | ICD-10-CM | POA: Diagnosis not present

## 2018-11-28 DIAGNOSIS — R599 Enlarged lymph nodes, unspecified: Secondary | ICD-10-CM | POA: Diagnosis not present

## 2018-11-28 DIAGNOSIS — I739 Peripheral vascular disease, unspecified: Secondary | ICD-10-CM | POA: Diagnosis not present

## 2018-11-28 DIAGNOSIS — R0989 Other specified symptoms and signs involving the circulatory and respiratory systems: Secondary | ICD-10-CM | POA: Diagnosis not present

## 2019-02-07 IMAGING — CR DG LUMBAR SPINE COMPLETE 4+V
5 series · 5 of 5 positions shown · non-contrast
Comparison: Chest x-ray 11/12/2016 .

CLINICAL DATA: Back pain.  No known injury .

EXAM:
LUMBAR SPINE - COMPLETE 4+ VIEW

[t lumbar spine ap]
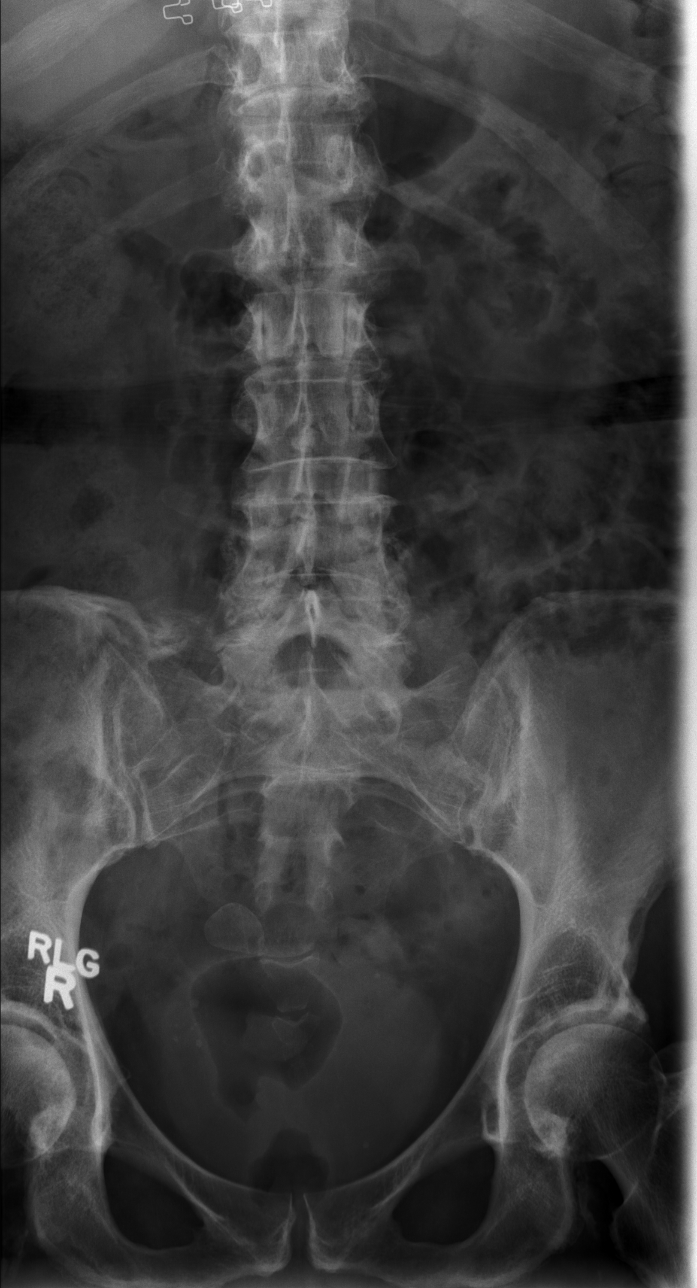

[t lumbar spine obl (1 of 2)]
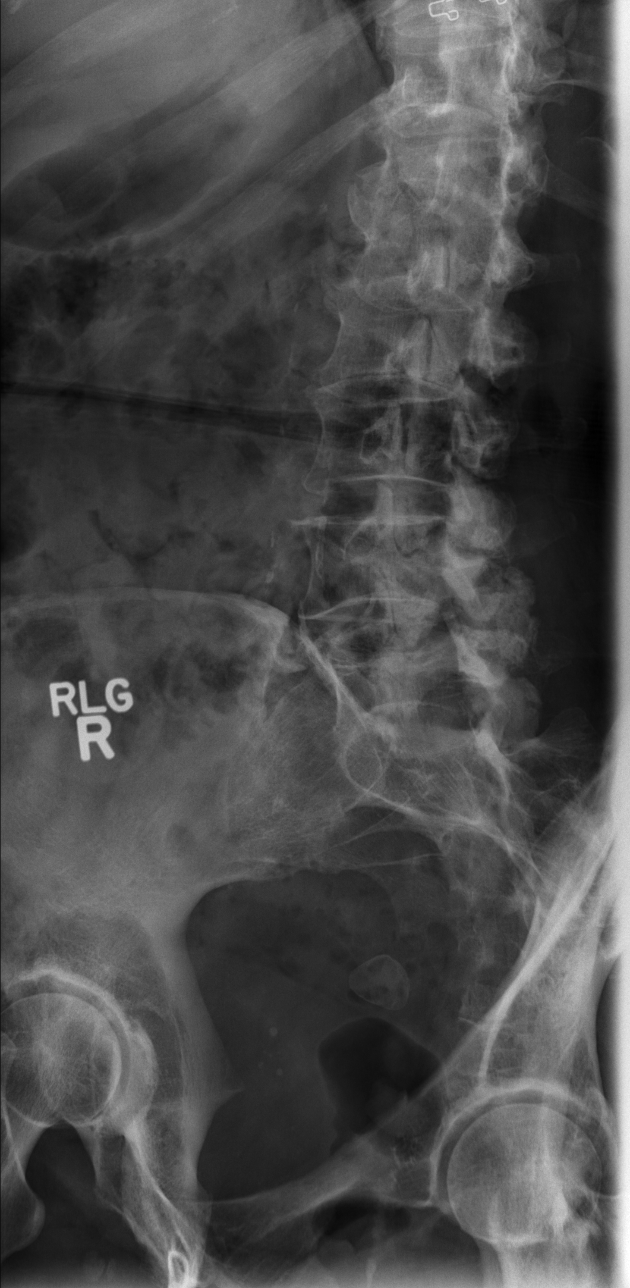

[t lumbar spine obl (2 of 2)]
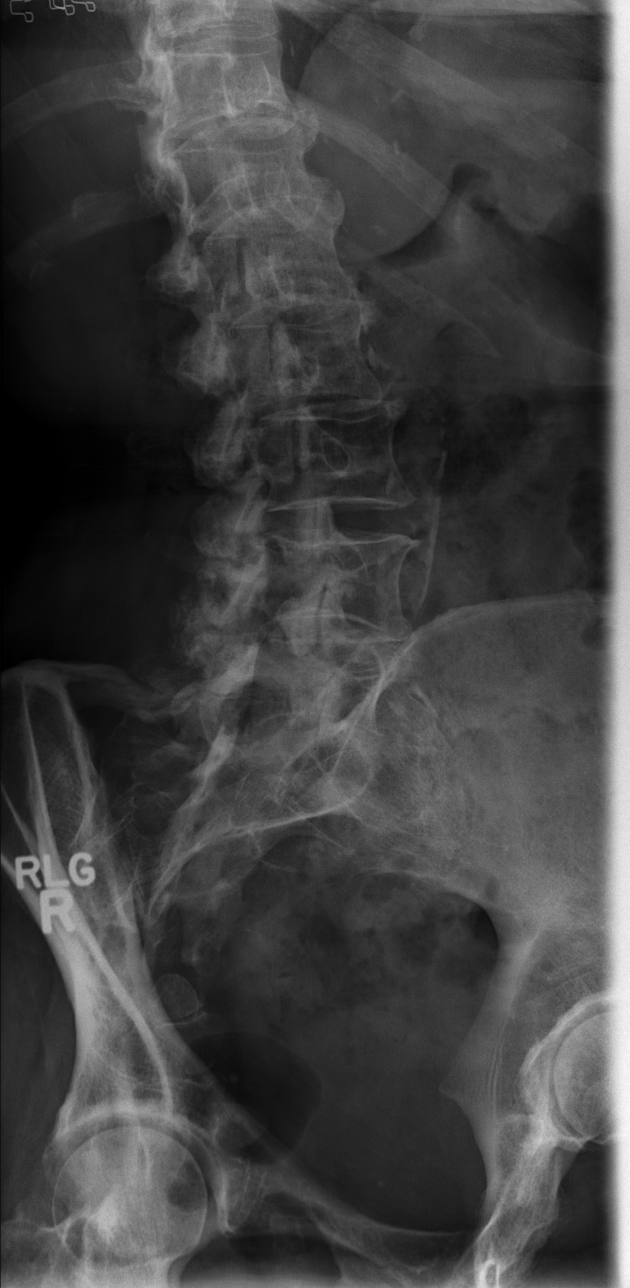

[t lumbar spine lat]
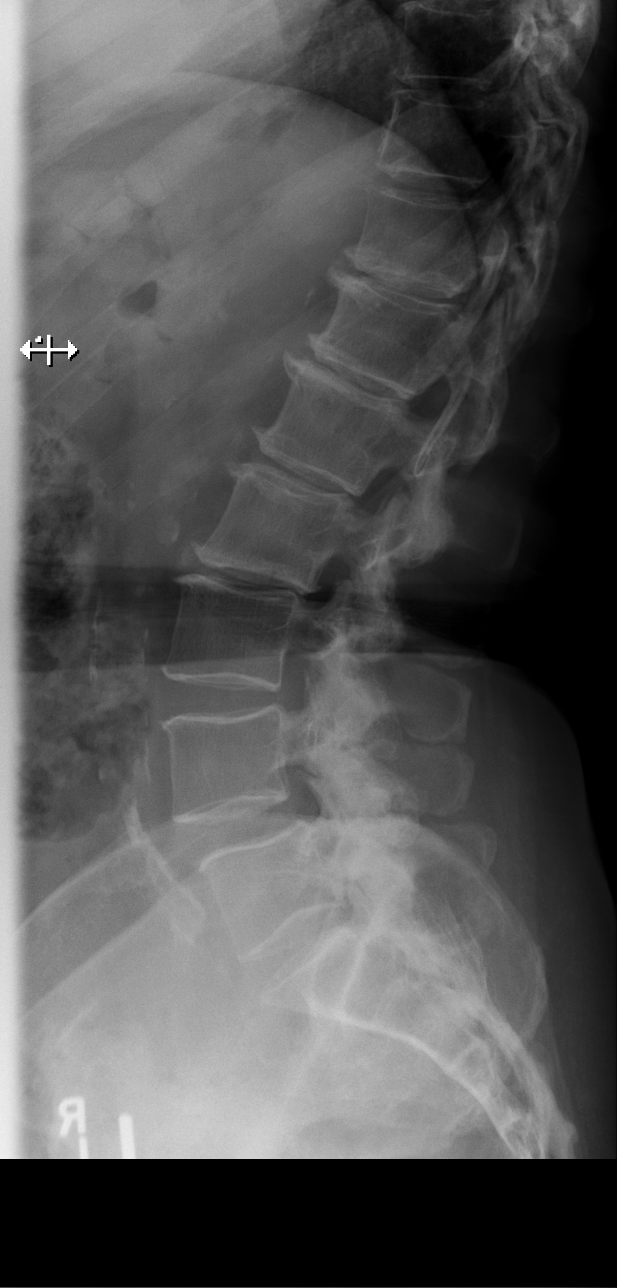

[t lumbar l-5 s-1 spot]
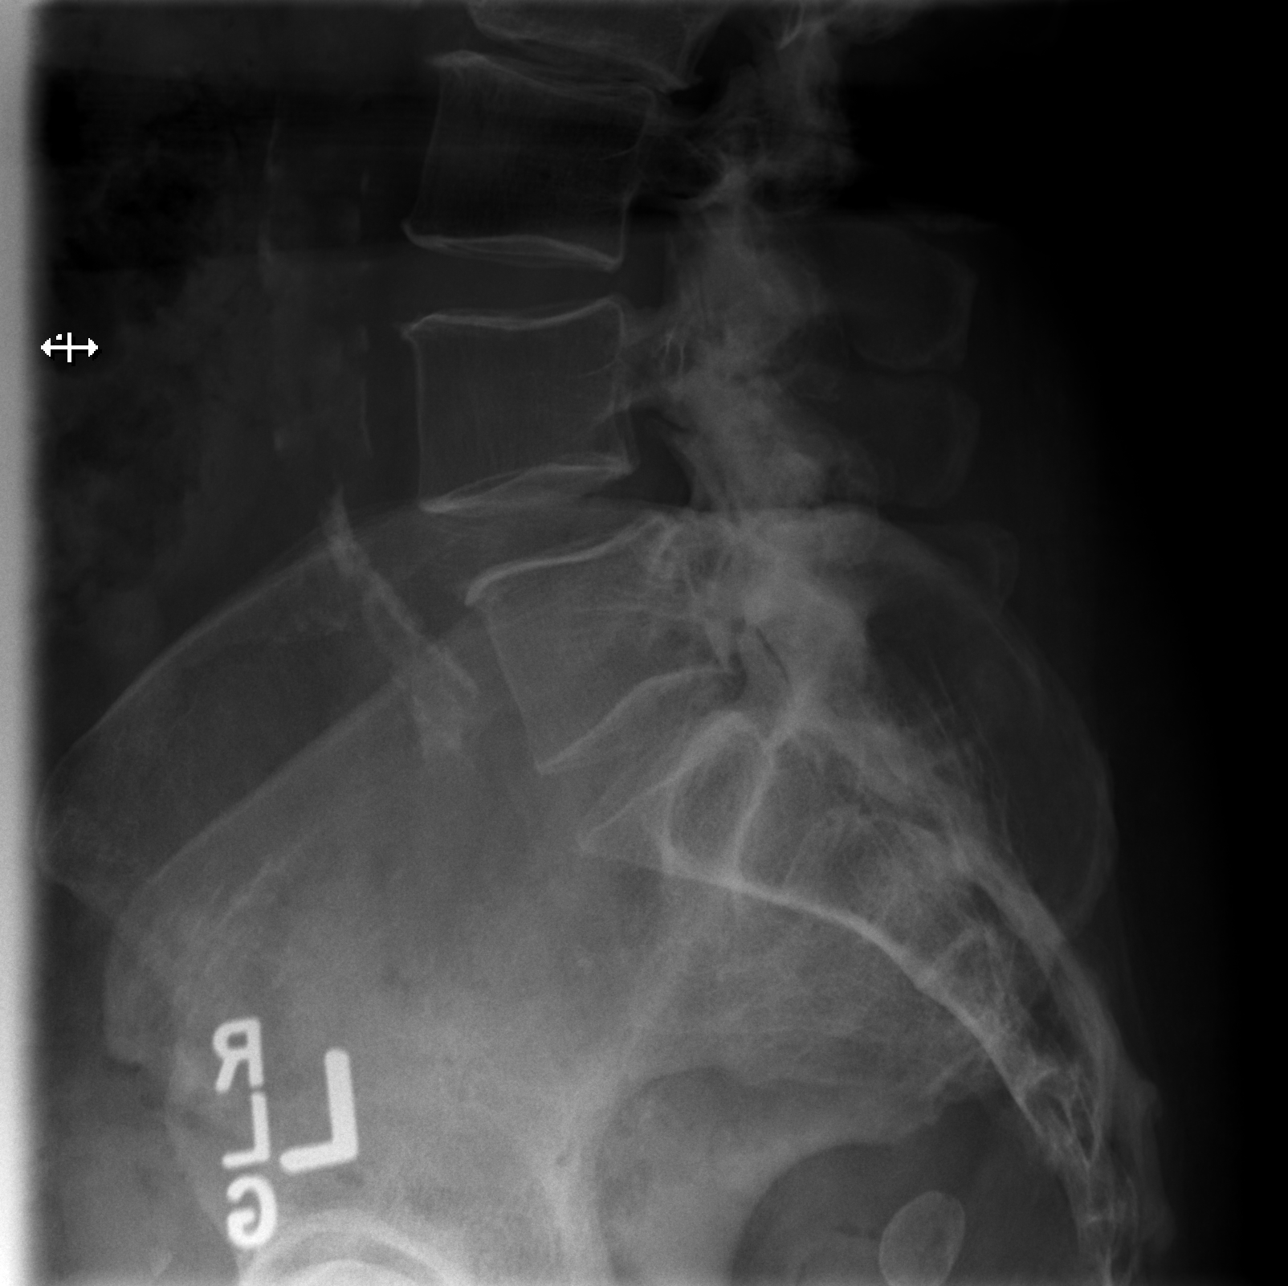

[5 of 5 positions shown; findings below may reference images not displayed]

FINDINGS: Lumbar spine numbered with the lowest segmented appearing lumbar
shaped vertebral lateral view as L5. Degenerative changes lumbar
spine with scoliosis concave right. 3 mm anterolisthesis L4 on L5 .
Prominent calcification in the pelvis most likely a fibroid.
Calcified pelvic densities noted consistent with phleboliths.
Aortoiliac atherosclerotic vascular disease.
IMPRESSION: 1. Diffuse multilevel prominent degenerative change with 3 mm
anterolisthesis L4 on L5. No acute bony abnormality.

2. Aortoiliac atherosclerotic vascular disease.

## 2019-02-10 IMAGING — CR DG CHEST 2V
2 series · 2 of 2 positions shown · non-contrast
Comparison: Fall 15,017

CLINICAL DATA: Lethargy, altered mental status, symptoms for a
unknown duration, history dementia, hypertension

EXAM:
CHEST  2 VIEW

[w chest lat]
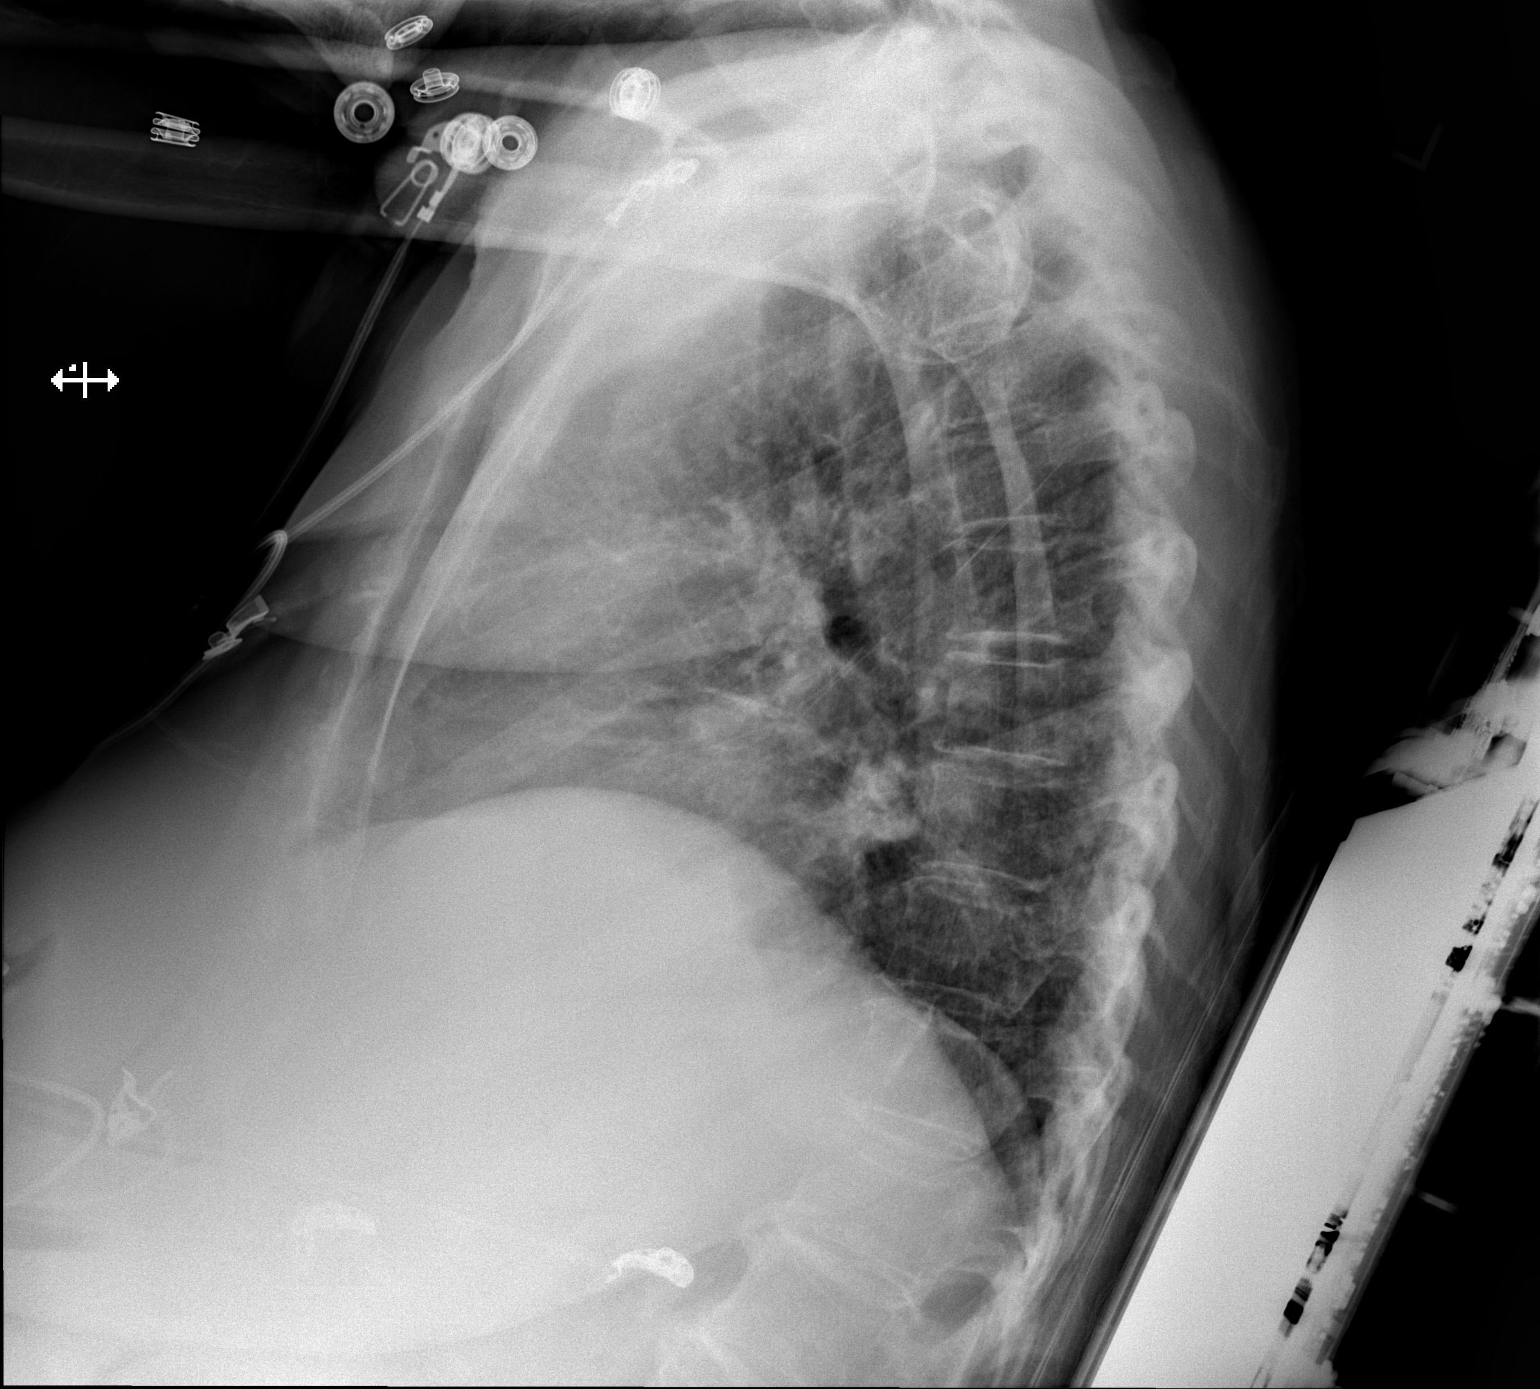

[x chest ap]
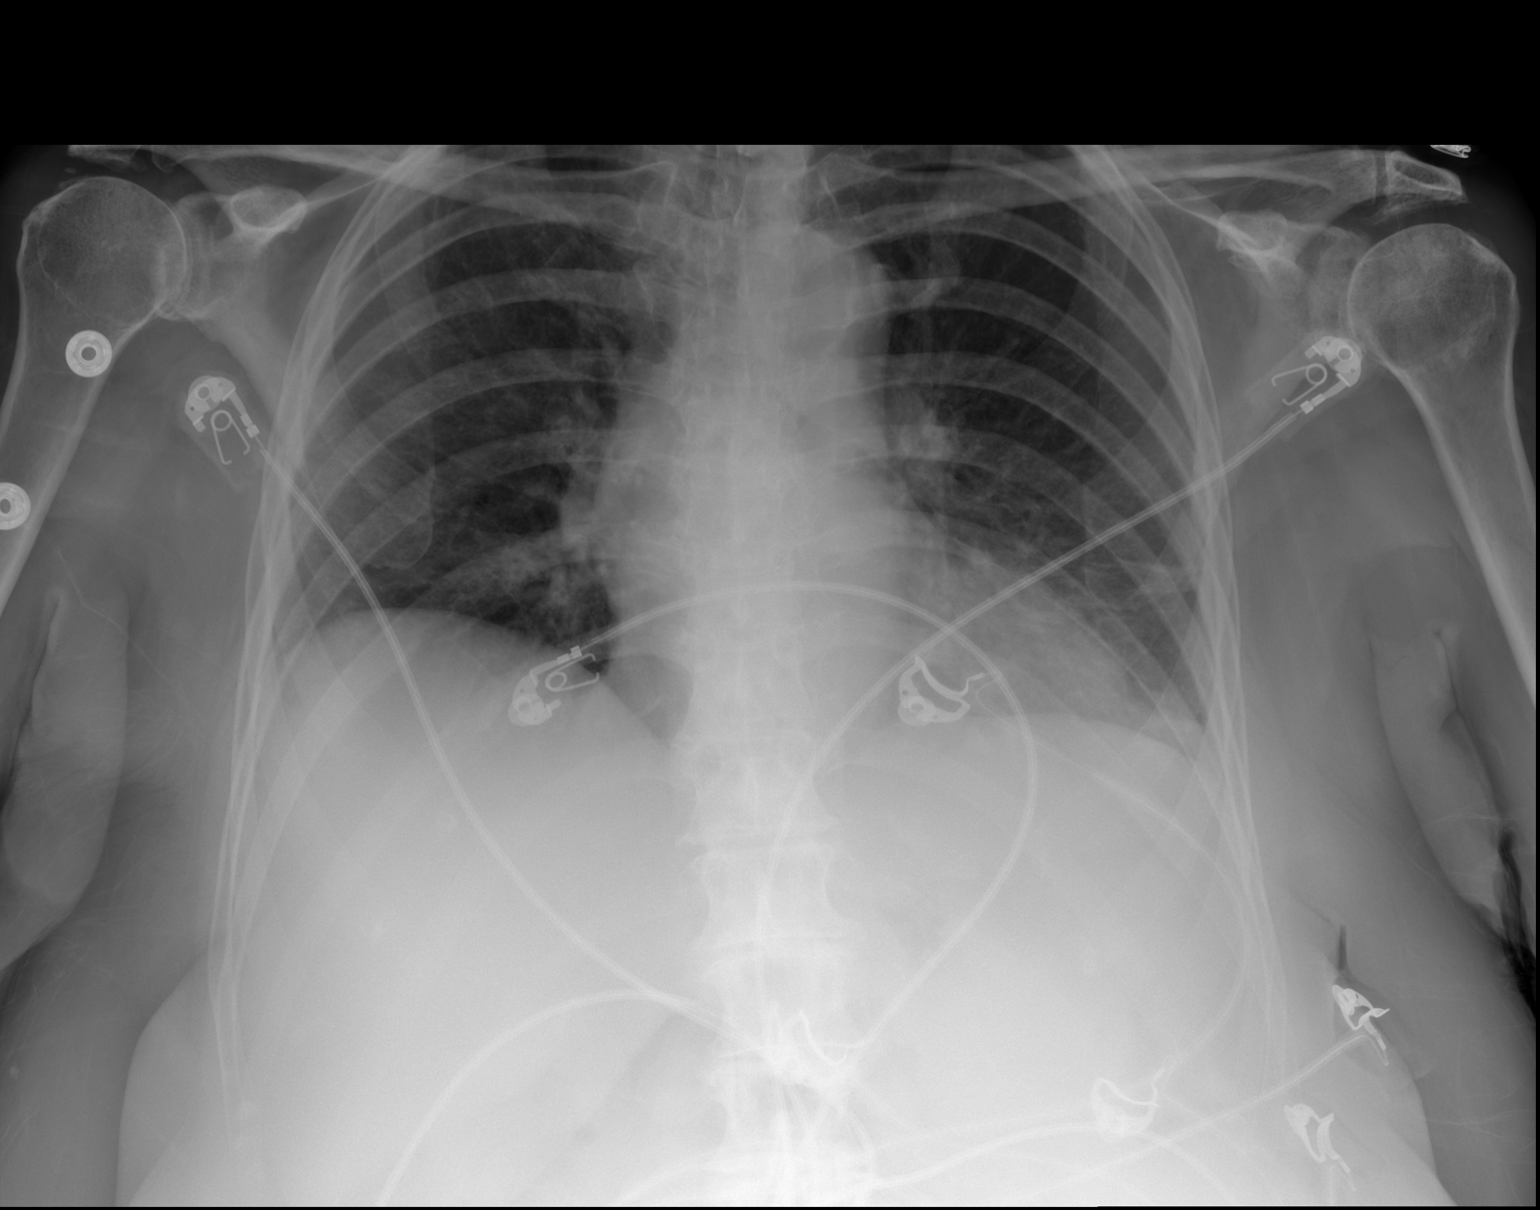

[2 of 2 positions shown; findings below may reference images not displayed]

FINDINGS: Upper normal size of cardiac silhouette.

Mediastinal contours and pulmonary vascularity normal.

Bibasilar atelectasis.

Lungs otherwise clear.

No pleural effusion or pneumothorax.

Bones unremarkable.
IMPRESSION: Bibasilar atelectasis.

## 2024-02-28 DEATH — deceased
